# Patient Record
Sex: Female | Born: 1955 | Race: White | Hispanic: No | Smoking: Never smoker
Health system: Southern US, Community
[De-identification: ages and names within clinical notes are randomized; demographics above are authoritative.]

## PROBLEM LIST (undated history)

## (undated) DIAGNOSIS — F32A Depression, unspecified: Secondary | ICD-10-CM

## (undated) DIAGNOSIS — M1990 Unspecified osteoarthritis, unspecified site: Secondary | ICD-10-CM

## (undated) DIAGNOSIS — F419 Anxiety disorder, unspecified: Secondary | ICD-10-CM

## (undated) DIAGNOSIS — K219 Gastro-esophageal reflux disease without esophagitis: Secondary | ICD-10-CM

## (undated) DIAGNOSIS — H269 Unspecified cataract: Secondary | ICD-10-CM

## (undated) DIAGNOSIS — Z9889 Other specified postprocedural states: Secondary | ICD-10-CM

## (undated) DIAGNOSIS — E785 Hyperlipidemia, unspecified: Secondary | ICD-10-CM

## (undated) DIAGNOSIS — R7303 Prediabetes: Secondary | ICD-10-CM

## (undated) DIAGNOSIS — T7840XA Allergy, unspecified, initial encounter: Secondary | ICD-10-CM

## (undated) DIAGNOSIS — G709 Myoneural disorder, unspecified: Secondary | ICD-10-CM

## (undated) DIAGNOSIS — R112 Nausea with vomiting, unspecified: Secondary | ICD-10-CM

## (undated) DIAGNOSIS — M810 Age-related osteoporosis without current pathological fracture: Secondary | ICD-10-CM

## (undated) HISTORY — DX: Allergy, unspecified, initial encounter: T78.40XA

## (undated) HISTORY — DX: Anxiety disorder, unspecified: F41.9

## (undated) HISTORY — PX: OTHER SURGICAL HISTORY: SHX169

## (undated) HISTORY — DX: Depression, unspecified: F32.A

## (undated) HISTORY — DX: Nausea with vomiting, unspecified: R11.2

## (undated) HISTORY — DX: Prediabetes: R73.03

## (undated) HISTORY — DX: Age-related osteoporosis without current pathological fracture: M81.0

## (undated) HISTORY — DX: Unspecified osteoarthritis, unspecified site: M19.90

## (undated) HISTORY — DX: Unspecified cataract: H26.9

## (undated) HISTORY — DX: Hyperlipidemia, unspecified: E78.5

## (undated) HISTORY — DX: Myoneural disorder, unspecified: G70.9

## (undated) HISTORY — DX: Gastro-esophageal reflux disease without esophagitis: K21.9

## (undated) HISTORY — DX: Other specified postprocedural states: Z98.890

---

## 2000-02-11 ENCOUNTER — Other Ambulatory Visit: Admission: RE | Admit: 2000-02-11 | Discharge: 2000-02-11 | Payer: Self-pay | Admitting: Gynecology

## 2001-01-19 ENCOUNTER — Other Ambulatory Visit: Admission: RE | Admit: 2001-01-19 | Discharge: 2001-01-19 | Payer: Self-pay | Admitting: Gynecology

## 2003-02-07 ENCOUNTER — Other Ambulatory Visit: Admission: RE | Admit: 2003-02-07 | Discharge: 2003-02-07 | Payer: Self-pay | Admitting: Gynecology

## 2004-04-12 ENCOUNTER — Other Ambulatory Visit: Admission: RE | Admit: 2004-04-12 | Discharge: 2004-04-12 | Payer: Self-pay | Admitting: Gynecology

## 2006-09-15 ENCOUNTER — Other Ambulatory Visit: Admission: RE | Admit: 2006-09-15 | Discharge: 2006-09-15 | Payer: Self-pay | Admitting: Family Medicine

## 2006-09-16 ENCOUNTER — Encounter: Admission: RE | Admit: 2006-09-16 | Discharge: 2006-11-03 | Payer: Self-pay | Admitting: Family Medicine

## 2010-11-19 ENCOUNTER — Other Ambulatory Visit: Payer: Self-pay | Admitting: Family Medicine

## 2010-11-19 DIAGNOSIS — N63 Unspecified lump in unspecified breast: Secondary | ICD-10-CM

## 2010-11-19 DIAGNOSIS — N644 Mastodynia: Secondary | ICD-10-CM

## 2010-11-26 ENCOUNTER — Ambulatory Visit
Admission: RE | Admit: 2010-11-26 | Discharge: 2010-11-26 | Disposition: A | Discharge status: Home / Self Care | Payer: BC Managed Care – PPO | Source: Ambulatory Visit | Attending: Family Medicine | Admitting: Family Medicine

## 2010-11-26 DIAGNOSIS — N644 Mastodynia: Secondary | ICD-10-CM

## 2010-11-26 DIAGNOSIS — N63 Unspecified lump in unspecified breast: Secondary | ICD-10-CM

## 2011-12-25 ENCOUNTER — Other Ambulatory Visit: Payer: Self-pay | Admitting: Family Medicine

## 2011-12-25 ENCOUNTER — Other Ambulatory Visit (HOSPITAL_COMMUNITY)
Admission: RE | Admit: 2011-12-25 | Discharge: 2011-12-25 | Disposition: A | Discharge status: Home / Self Care | Payer: BC Managed Care – PPO | Source: Ambulatory Visit | Attending: Family Medicine | Admitting: Family Medicine

## 2011-12-25 DIAGNOSIS — Z124 Encounter for screening for malignant neoplasm of cervix: Secondary | ICD-10-CM | POA: Insufficient documentation

## 2011-12-25 DIAGNOSIS — Z1151 Encounter for screening for human papillomavirus (HPV): Secondary | ICD-10-CM | POA: Insufficient documentation

## 2012-07-28 ENCOUNTER — Ambulatory Visit: Payer: BC Managed Care – PPO | Admitting: Sports Medicine

## 2012-07-31 ENCOUNTER — Ambulatory Visit: Payer: BC Managed Care – PPO | Admitting: Sports Medicine

## 2013-09-16 ENCOUNTER — Other Ambulatory Visit: Payer: Self-pay

## 2013-09-16 DIAGNOSIS — Z1231 Encounter for screening mammogram for malignant neoplasm of breast: Secondary | ICD-10-CM

## 2013-09-24 ENCOUNTER — Ambulatory Visit: Payer: BC Managed Care – PPO

## 2013-09-29 ENCOUNTER — Ambulatory Visit
Admission: RE | Admit: 2013-09-29 | Discharge: 2013-09-29 | Disposition: A | Discharge status: Home / Self Care | Payer: 59 | Source: Ambulatory Visit

## 2013-09-29 DIAGNOSIS — Z1231 Encounter for screening mammogram for malignant neoplasm of breast: Secondary | ICD-10-CM

## 2014-12-21 ENCOUNTER — Other Ambulatory Visit: Payer: Self-pay

## 2014-12-21 DIAGNOSIS — Z1231 Encounter for screening mammogram for malignant neoplasm of breast: Secondary | ICD-10-CM

## 2014-12-27 ENCOUNTER — Ambulatory Visit
Admission: RE | Admit: 2014-12-27 | Discharge: 2014-12-27 | Disposition: A | Discharge status: Home / Self Care | Payer: 59 | Source: Ambulatory Visit

## 2014-12-27 DIAGNOSIS — Z1231 Encounter for screening mammogram for malignant neoplasm of breast: Secondary | ICD-10-CM

## 2015-01-10 ENCOUNTER — Other Ambulatory Visit: Payer: Self-pay | Admitting: Family Medicine

## 2015-01-10 DIAGNOSIS — R1310 Dysphagia, unspecified: Secondary | ICD-10-CM

## 2015-01-13 ENCOUNTER — Ambulatory Visit
Admission: RE | Admit: 2015-01-13 | Discharge: 2015-01-13 | Disposition: A | Discharge status: Home / Self Care | Payer: 59 | Source: Ambulatory Visit | Attending: Family Medicine | Admitting: Family Medicine

## 2015-01-13 DIAGNOSIS — R1310 Dysphagia, unspecified: Secondary | ICD-10-CM

## 2017-01-21 ENCOUNTER — Other Ambulatory Visit (HOSPITAL_COMMUNITY)
Admission: RE | Admit: 2017-01-21 | Discharge: 2017-01-21 | Disposition: A | Discharge status: Home / Self Care | Payer: 59 | Source: Ambulatory Visit | Attending: Family Medicine | Admitting: Family Medicine

## 2017-01-21 ENCOUNTER — Other Ambulatory Visit: Payer: Self-pay | Admitting: Family Medicine

## 2017-01-21 DIAGNOSIS — Z01419 Encounter for gynecological examination (general) (routine) without abnormal findings: Secondary | ICD-10-CM | POA: Insufficient documentation

## 2017-01-23 LAB — CYTOLOGY - PAP: Diagnosis: NEGATIVE

## 2019-05-14 ENCOUNTER — Ambulatory Visit: Payer: 59

## 2019-05-21 ENCOUNTER — Ambulatory Visit: Payer: Medicare Other

## 2019-05-31 ENCOUNTER — Ambulatory Visit: Payer: 59

## 2020-03-22 ENCOUNTER — Other Ambulatory Visit: Payer: Self-pay | Admitting: Family Medicine

## 2020-03-22 DIAGNOSIS — Z1231 Encounter for screening mammogram for malignant neoplasm of breast: Secondary | ICD-10-CM

## 2020-03-22 DIAGNOSIS — M85859 Other specified disorders of bone density and structure, unspecified thigh: Secondary | ICD-10-CM

## 2020-06-02 ENCOUNTER — Encounter: Payer: Medicare Other | Attending: Family Medicine | Admitting: Registered"

## 2020-06-02 DIAGNOSIS — R7303 Prediabetes: Secondary | ICD-10-CM | POA: Diagnosis not present

## 2020-06-08 ENCOUNTER — Encounter: Payer: Self-pay | Admitting: Registered"

## 2020-06-08 NOTE — Progress Notes (Signed)
On 06/02/20 patient completed Core Session 1 of Diabetes Prevention Program course virtually with [facility]. The following learning objectives were met by the patient during this class:   Virtual Visit via Video Note  I connected with Kern Alberta by a video enabled application and verified that I am speaking with the correct person.  Location: Patient: Home.  Provider: Office.     Learning Objectives:   Be able to explain the purpose and benefits of the National Diabetes Prevention Program.   Be able to describe the events that will take place at every session.   Know the weight loss and physical activity goals established by the [facility] Diabetes Prevention Program.   Know their own individual weight loss and physical activity goals.   Be able to explain the important effect of self-monitoring on behavior change.   Goals:  . Record food and beverage intake in "Food and Activity Tracker" over the next week.  . E-mail completed "Food and Activity Tracker" to Lifestyle Coach next week before session 2. . Circle the foods or beverages you think are highest in fat and calories in your food tracker. . Read the labels on the food you buy, and consider using measuring cups and spoons to help you calculate the amount you eat. We will talk about measuring in more detail in the coming weeks.   Follow-Up Plan:  Attend Core Session 2 next week.   E-mail completed "Food and Activity Tracker" to Lifestyle Coach next week before class.

## 2020-06-09 ENCOUNTER — Encounter: Payer: Medicare Other | Attending: Family Medicine | Admitting: Registered"

## 2020-06-09 DIAGNOSIS — R7303 Prediabetes: Secondary | ICD-10-CM | POA: Diagnosis not present

## 2020-06-12 ENCOUNTER — Other Ambulatory Visit: Payer: Medicare Other

## 2020-06-12 ENCOUNTER — Ambulatory Visit: Payer: Medicare Other

## 2020-06-12 ENCOUNTER — Encounter: Payer: Self-pay | Admitting: Registered"

## 2020-06-12 NOTE — Progress Notes (Addendum)
On 06/09/20 patient completed Core Session 2 of Diabetes Prevention Program course virtually with [facility]. The following learning objectives were met by the patient during this class:   Virtual Visit via Video Note  I connected with Tara Carr on 06/09/20 at  3:30 PM EST by a video enabled application and verified that I am speaking with the correct person using two identifiers.  Location: Patient: Home.  Provider: Office.   Learning Objectives:  Self-monitor their weight during the weeks following Session 2.   Describe the relationship between fat and calories.   Explain the reason for, and basic principles of, self-monitoring fat grams and calories.   Identify their personal fat gram goals.   Use the ?Fat and Calorie Counter to calculate the calories and fat grams of a given selection of foods.   Keep a running total of the fat grams they eat each day.   Calculate fat, calories, and serving sizes from nutrition labels.   Goals:   Weigh yourself at the same time each day, or every few days, and record your weight in your Food and Activity Tracker.  Write down everything you eat and drink in your Food and Activity Tracker.  Measure portions as much as you can, and start reading labels.   Use the ?Fat and Calorie Counter to figure out the amount of fat and calories in what you ate, and write the amount down in your Food and Activity Tracker.  Keep a running fat gram total throughout the day. Come as close to your fat gram goal as you can.   Follow-Up Plan:  Attend Core Session 3 next week.   Email completed  "Food and Activity Tracker" to Lifestyle Coach next week.   

## 2020-06-16 ENCOUNTER — Encounter: Payer: Medicare Other | Attending: Family Medicine | Admitting: Registered"

## 2020-06-16 DIAGNOSIS — R7303 Prediabetes: Secondary | ICD-10-CM | POA: Insufficient documentation

## 2020-06-22 ENCOUNTER — Encounter: Payer: Self-pay | Admitting: Registered"

## 2020-06-22 NOTE — Progress Notes (Signed)
On 06/16/20 patient completed Core Session 3 of Diabetes Prevention Program course virtually with [facility]. The following learning objectives were met by the patient during this class:   Virtual Visit via Video Note  I connected with Tara Carr on 06/16/20 at  3:30 PM EST by a video enabled telemedicine application and verified that I am speaking with the correct person using two identifiers.  Location: Patient: Home.  Provider: Office.    Learning Objectives:  Weigh and measure foods.  Estimate the fat and calorie content of common foods.  Describe three ways to eat less fat and fewer calories.  Create a plan to eat less fat for the following week.   Goals:   Track weight when weighing outside of class.   Track food and beverages eaten each day in Food and Activity Tracker and include fat grams and calories for each.   Try to stay within fat gram goal.   Complete plan for eating less high fat foods and answer related homework questions.    Follow-Up Plan:  Attend Core Session 4 next week.   Bring completed "Food and Activity Tracker" next week to be reviewed by Lifestyle Coach.

## 2020-06-23 ENCOUNTER — Encounter (HOSPITAL_BASED_OUTPATIENT_CLINIC_OR_DEPARTMENT_OTHER): Payer: Medicare Other | Admitting: Registered"

## 2020-06-23 DIAGNOSIS — R7303 Prediabetes: Secondary | ICD-10-CM | POA: Diagnosis not present

## 2020-06-26 ENCOUNTER — Encounter: Payer: Self-pay | Admitting: Registered"

## 2020-06-26 NOTE — Progress Notes (Signed)
On 06/23/20 patient completed Core Session 4 of Diabetes Prevention Program course virtually with [facility]. The following learning objectives were met by the patient during this class:   Virtual Visit via Video Note  I connected with Tara Carr on 06/23/20 at  3:30 PM EST by a video enabled application and verified that I am speaking with the correct person using two identifiers.  Location: Patient: Home.  Provider: Office.   Learning Objectives:  Describe the MyPlate food guide and its recommendations, including how to reduce fat and calories in our diet.  Compare and contrast MyPlate guidelines with participants' eating habits.  List ways to replace high-fat and high-calorie foods with low-fat and low-calorie foods.  Explain the importance of eating plenty of whole grains, vegetables, and fruits, while staying within fat gram goals.  Explain the importance of eating foods from all groups of MyPlate and of eating a variety of foods from within each group.  Explain why a balanced diet is beneficial to health.  Goals:   Record weight taken outside of class.   Track foods and beverages eaten each day in the "Food and Activity Tracker," including calories and fat grams for each item.   Practice comparing what you eat with the recommendations of MyPlate using the "Rate Your Plate" handout.   Complete the "Rate Your Plate" handout form on at least 3 days.   Answer homework questions.   Follow-Up Plan:  Attend Core Session 5 next week.   Email completed "Food and Activity Tracker" next week to be reviewed by Lifestyle Coach.

## 2020-06-30 ENCOUNTER — Encounter: Payer: Self-pay | Admitting: Registered"

## 2020-06-30 ENCOUNTER — Encounter (HOSPITAL_BASED_OUTPATIENT_CLINIC_OR_DEPARTMENT_OTHER): Payer: Medicare Other | Admitting: Registered"

## 2020-06-30 DIAGNOSIS — R7303 Prediabetes: Secondary | ICD-10-CM

## 2020-06-30 NOTE — Progress Notes (Signed)
On 06/30/20 patient completed Core Session 5 of Diabetes Prevention Program course virtually with [facility]. The following learning objectives were met by the patient during this class:   Virtual Visit via Video Note  I connected with Tara Carr on 06/30/20 at  3:30 PM EDT by a video enabled application and verified that I am speaking with the correct person using two identifiers.  Location: Patient: Home.  Provider: Office.   Learning Objectives:  Establish a physical activity goal.  Explain the importance of the physical activity goal.  Describe their current level of physical activity.  Name ways that they are already physically active.  Develop personal plans for physical activity for the next week.   Goals:   Record weight taken outside of class.   Track foods and beverages eaten each day in the "Food and Activity Tracker," including calories and fat grams for each item.   Make an Activity Plan including date, specific type of activity, and length of time you plan to be active that includes at last 60 minutes of activity for the week.   Track activity type, minutes you were active, and distance you reached each day in the "Food and Activity Tracker."   Follow-Up Plan: . Attend Core Session 6 next week.  . E-mail completed "Food and Activity Tracker" to Lifestyle Coach next week before class

## 2020-07-07 ENCOUNTER — Encounter (HOSPITAL_BASED_OUTPATIENT_CLINIC_OR_DEPARTMENT_OTHER): Payer: Medicare Other | Admitting: Registered"

## 2020-07-07 DIAGNOSIS — R7303 Prediabetes: Secondary | ICD-10-CM | POA: Diagnosis not present

## 2020-07-12 ENCOUNTER — Encounter: Payer: Self-pay | Admitting: Registered"

## 2020-07-12 NOTE — Progress Notes (Signed)
On 07/07/20 patient completed Core Session 6 of Diabetes Prevention Program course virtually with [facility]. The following learning objectives were met by the patient during this class:   Virtual Visit via Video Note  I connected with Tara Carr on 07/07/20 at  3:30 PM EDT by a video enabled application and verified that I am speaking with the correct person using two identifiers.  Location: Patient: Home.  Provider: Office.   Learning Objectives:  Graph their daily physical activity.   Describe two ways of finding the time to be active.   Define "lifestyle activity."   Describe how to prevent injury.   Develop an activity plan for the coming week.   Goals:   Record weight taken outside of class.   Track foods and beverages eaten each day in the "Food and Activity Tracker," including calories and fat grams for each item.    Track activity type, minutes you were active, and distance you reached each day in the "Food and Activity Tracker."   Set aside one 20 to 30-minute block of time every day or find two or more periods of 10 to15 minutes each for physical activity.   Warm up, cool down, and stretch.  Make a Physical Activities Plan for the Week.   Follow-Up Plan:  Attend Core Session 7 next week.   E-mail completed "Food and Activity Tracker" to Lifestyle Coach next week before class

## 2020-07-14 ENCOUNTER — Encounter: Payer: Medicare Other | Attending: Family Medicine | Admitting: Registered"

## 2020-07-14 DIAGNOSIS — R7303 Prediabetes: Secondary | ICD-10-CM | POA: Diagnosis not present

## 2020-07-21 ENCOUNTER — Encounter: Payer: Self-pay | Admitting: Registered"

## 2020-07-21 NOTE — Progress Notes (Signed)
On 07/14/20 patient completed Core Session 7 of Diabetes Prevention Program course virtually with [facility]. The following learning objectives were met by the patient during this class:   Virtual Visit via Video Note  I connected with Tara Carr on 07/14/20 at  3:30 PM EDT by a video enabled application and verified that I am speaking with the correct person using two identifiers.  Location: Patient: Home.  Provider: Office.   Learning Objectives:  Define calorie balance.  Explain how healthy eating and being active are related in terms of calorie balance.   Describe the relationship between calorie balance and weight loss.   Describe his or her progress as it relates to calorie balance.   Develop an activity plan for the coming week.   Goals:   Record weight taken outside of class.   Track foods and beverages eaten each day in the "Food and Activity Tracker," including calories and fat grams for each item.    Track activity type, minutes you were active, and distance you reached each day in the "Food and Activity Tracker."   Set aside one 20 to 30-minute block of time every day or find two or more periods of 10 to15 minutes each for physical activity.   Make a Physical Activities Plan for the Week.   Make active lifestyle choices all through the day   Stay at or go slightly over activity goal.   Follow-Up Plan:  Attend Core Session 8 next week.   E-mail completed "Food and Activity Tracker" to Lifestyle Coach next week before class

## 2020-07-31 ENCOUNTER — Ambulatory Visit
Admission: RE | Admit: 2020-07-31 | Discharge: 2020-07-31 | Disposition: A | Discharge status: Home / Self Care | Payer: Medicare Other | Source: Ambulatory Visit | Attending: Family Medicine | Admitting: Family Medicine

## 2020-07-31 ENCOUNTER — Other Ambulatory Visit: Payer: Self-pay

## 2020-07-31 DIAGNOSIS — Z1231 Encounter for screening mammogram for malignant neoplasm of breast: Secondary | ICD-10-CM

## 2020-08-04 ENCOUNTER — Encounter: Payer: Self-pay | Admitting: Registered"

## 2020-08-04 ENCOUNTER — Encounter (HOSPITAL_BASED_OUTPATIENT_CLINIC_OR_DEPARTMENT_OTHER): Payer: Medicare Other | Admitting: Registered"

## 2020-08-04 DIAGNOSIS — R7303 Prediabetes: Secondary | ICD-10-CM

## 2020-08-04 DIAGNOSIS — Z713 Dietary counseling and surveillance: Secondary | ICD-10-CM | POA: Diagnosis not present

## 2020-08-04 NOTE — Progress Notes (Signed)
On 08/04/20 patient completed Core Session 9 of Diabetes Prevention Program course virtually with [facility]. The following learning objectives were met by the patient during this class:   Virtual Visit via Video Note  I connected with Tara Carr by a video enabled application and verified that I am speaking with the correct person using two identifiers.  Location: Patient: Home.  Provider: Office.   Learning Objectives:  List and describe five steps to problem solving.   Apply the five problem solving steps to resolve a problem he or she has with eating less fat and fewer calories or being more active.   Goals:   Record weight taken outside of class.   Track foods and beverages eaten each day in the "Food and Activity Tracker," including calories and fat grams for each item.    Track activity type, minutes you were active, and distance you reached each day in the "Food and Activity Tracker."   Set aside one 20 to 30-minute block of time every day or find two or more periods of 10 to15 minutes each for physical activity.   Use problem solving action plan created during session to problem solve.   Follow-Up Plan:  Attend Core Session 10 next week.   Email completed "Food and Activity Tracker" next week to be reviewed by Lifestyle Coach.  Email menus from favorite restaurants to next session for future discussion.

## 2020-08-07 ENCOUNTER — Ambulatory Visit
Admission: RE | Admit: 2020-08-07 | Discharge: 2020-08-07 | Disposition: A | Discharge status: Home / Self Care | Payer: Medicare Other | Source: Ambulatory Visit | Attending: Family Medicine | Admitting: Family Medicine

## 2020-08-07 ENCOUNTER — Other Ambulatory Visit: Payer: Medicare Other

## 2020-08-07 ENCOUNTER — Other Ambulatory Visit: Payer: Self-pay

## 2020-08-07 DIAGNOSIS — M85859 Other specified disorders of bone density and structure, unspecified thigh: Secondary | ICD-10-CM

## 2020-08-11 ENCOUNTER — Encounter: Payer: Medicare Other | Admitting: Registered"

## 2020-08-11 DIAGNOSIS — R7303 Prediabetes: Secondary | ICD-10-CM

## 2020-08-25 ENCOUNTER — Other Ambulatory Visit: Payer: Self-pay

## 2020-08-25 ENCOUNTER — Encounter: Payer: Medicare Other | Attending: Family Medicine | Admitting: Dietician

## 2020-08-25 DIAGNOSIS — R7303 Prediabetes: Secondary | ICD-10-CM | POA: Diagnosis not present

## 2020-08-25 NOTE — Progress Notes (Signed)
On 08/25/2020 patient completed Session 11 of Diabetes Prevention Program course virtually with [facility]. By the end of this session patients are able to complete the following objectives:   Virtual Visit via Video Note  I connected with Tara Carr, 06-06-1955 by a video enabled application and verified that I am speaking with the correct person using two identifiers.  Location: Patient: Virtual Provider: Office  Learning Objectives:  Give examples of negative thoughts that could prevent them from meeting their goals of losing weight and being more physically active.   Describe how to stop negative thoughts and talk back to them with positive thoughts.   Practice 1) stopping negative thoughts and 2) talking back to negative thoughts with positive ones.    Goals:   Record weight taken outside of class.   Track foods and beverages eaten each day in the "Food and Activity Tracker," including calories and fat grams for each item.    Track activity type, minutes you were active, and distance you reached each day in the "Food and Activity Tracker."   If you have any negative thoughts-write them in your Food and Activity Trackers, along with how you talked back to them. Practice stopping negative thoughts and talking back to them with positive thoughts.   Follow-Up Plan:  Attend Core Session 12 next week.   Email completed "Food and Activity Tracker" before next week to be reviewed by Lifestyle Coach.

## 2020-09-01 ENCOUNTER — Encounter (HOSPITAL_BASED_OUTPATIENT_CLINIC_OR_DEPARTMENT_OTHER): Payer: Medicare Other | Admitting: Registered"

## 2020-09-01 DIAGNOSIS — Z713 Dietary counseling and surveillance: Secondary | ICD-10-CM | POA: Diagnosis not present

## 2020-09-01 DIAGNOSIS — R7303 Prediabetes: Secondary | ICD-10-CM | POA: Diagnosis not present

## 2020-09-06 ENCOUNTER — Encounter: Payer: Self-pay | Admitting: Registered"

## 2020-09-06 NOTE — Progress Notes (Signed)
Patient was seen on 09/01/20 for the Core Session 12 of Diabetes Prevention Program course at [facility]. By the end of this session patients are able to complete the following objectives:   Virtual Visit via Video Note  I connected with Tara Carr on 09/01/20 at  3:30 PM EDT by a video enabled application and verified that I am speaking with the correct person using two identifiers.  Location: Patient: Home.  Provider: Office.   Learning Objectives:  Describe their current progress toward defined goals.  Describe common causes for slipping from healthy eating or being  active.  Explain what to do to get back on their feet after a slip.  Goals:   Record weight taken outside of class.   Track foods and beverages eaten each day in the "Food and Activity Tracker," including calories and fat grams for each item.    Track activity type, minutes active, and distance reached each day in the "Food and Activity Tracker."   Try out the two action plans created during session- "Slips from Healthy Eating: Action Plan" and "Slips from Being Active: Action Plan"  Answer questions on the handout.   Follow-Up Plan:  Attend Core Session 13 next week.   Bring completed "Food and Activity Tracker" next week to be reviewed by Lifestyle Coach.

## 2020-09-08 ENCOUNTER — Encounter (HOSPITAL_BASED_OUTPATIENT_CLINIC_OR_DEPARTMENT_OTHER): Payer: Medicare Other | Admitting: Registered"

## 2020-09-08 ENCOUNTER — Encounter: Payer: Self-pay | Admitting: Registered"

## 2020-09-08 DIAGNOSIS — R7303 Prediabetes: Secondary | ICD-10-CM

## 2020-09-08 DIAGNOSIS — Z713 Dietary counseling and surveillance: Secondary | ICD-10-CM

## 2020-09-08 NOTE — Progress Notes (Signed)
On 09/08/20 patient completed the Core Session 13 of Diabetes Prevention Program course virtually with [facility]. By the end of this session patients are able to complete the following objectives:   Virtual Visit via Video Note  I connected with Kern Alberta on 09/08/20 at  3:30 PM EDT by a video enabled application and verified that I am speaking with the correct person using two identifiers.  Location: Patient: Home.  Provider: Office.   Learning Objectives:  Describe ways to add interest and variety to their activity plans.  Define ?aerobic fitness.  Explain the four F.I.T.T. principles (frequency, intensity, time, and type of activity) and how they relate to aerobic fitness.   Goals:   Record weight taken outside of class.   Track foods and beverages eaten each day in the "Food and Activity Tracker," including calories and fat grams for each item.    Track activity type, minutes you were active, and distance you reached each day in the "Food and Activity Tracker."   Do your best to reach activity goal for the week.  Use one of the F.I.T.T. principles to jump start workouts.  Document activity level on the "To Do Next Week" handout.  Follow-Up Plan:  Attend Core Session 14 next week.   Email completed "Food and Activity Tracker" before next week to be reviewed by Lifestyle Coach.

## 2020-09-15 ENCOUNTER — Encounter: Payer: Medicare Other | Attending: Family Medicine | Admitting: Registered"

## 2020-09-15 DIAGNOSIS — R7303 Prediabetes: Secondary | ICD-10-CM | POA: Insufficient documentation

## 2020-09-21 ENCOUNTER — Encounter: Payer: Self-pay | Admitting: Registered"

## 2020-09-21 NOTE — Progress Notes (Signed)
On 09/15/20 patient completed Core Session 14 of Diabetes Prevention Program course virtually with [facility]. By the end of this session patients are able to complete the following objectives:   Virtual Visit via Video Note  I connected with Kern Alberta at  3:30 PM EDT by a video enabled application and verified that I am speaking with the correct person using two identifiers.  Location: Patient: Home.  Provider: Office.   Learning Objectives: Give examples of problem social cues and helpful social cues.  Explain how to remove problem social cues and add helpful ones.  Describe ways of coping with vacations and social events such as parties, holidays, and visits from relatives and friends.  Create an action plan to change a problem social cue and add a helpful one.   Goals:  Record weight taken outside of class.  Track foods and beverages eaten each day in the "Food and Activity Tracker," including calories and fat grams for each item.   Track activity type, minutes you were active, and distance you reached each day in the "Food and Activity Tracker."  Do your best to reach activity goal for the week. Use action plan created during session to change a problem social cue and add a helpful social cue.  Answer questions regarding success of changing social cues on "To Do Next Week" handout.   Follow-Up Plan: Attend Core Session 15 next week.  Email completed "Food and Activity Tracker" before next week to be reviewed by Lifestyle Coach.

## 2020-09-22 ENCOUNTER — Encounter (HOSPITAL_BASED_OUTPATIENT_CLINIC_OR_DEPARTMENT_OTHER): Payer: Medicare Other | Admitting: Registered"

## 2020-09-22 DIAGNOSIS — R7303 Prediabetes: Secondary | ICD-10-CM | POA: Diagnosis not present

## 2020-09-29 ENCOUNTER — Encounter: Payer: Self-pay | Admitting: Registered"

## 2020-09-29 ENCOUNTER — Encounter (HOSPITAL_BASED_OUTPATIENT_CLINIC_OR_DEPARTMENT_OTHER): Payer: Medicare Other | Admitting: Registered"

## 2020-09-29 DIAGNOSIS — R7303 Prediabetes: Secondary | ICD-10-CM | POA: Diagnosis not present

## 2020-09-29 NOTE — Progress Notes (Signed)
On 09/22/20 patient completed Core Session 15 of Diabetes Prevention Program course virtually with [facility]. By the end of this session patients are able to complete the following objectives:   Virtual Visit via Video Note  I connected with Tara Carr on 09/22/20 at  3:30 PM EDT by a video enabled application and verified that I am speaking with the correct person using two identifiers.  Location: Patient: Home.  Provider: Office.   Learning Objectives: Explain how to prevent stress or cope with unavoidable stress.  Describe how this program can be a source of stress.  Explain how to manage stressful situations.  Create and follow an action plan for either preventing or coping with a stressful situation.   Goals:  Record weight taken outside of class.  Track foods and beverages eaten each day in the "Food and Activity Tracker," including calories and fat grams for each item.   Track activity type, minutes you were active, and distance you reached each day in the "Food and Activity Tracker."  Do your best to reach activity goal for the week. Follow your action plan to reduce stress.  Answer questions on handout regarding success of action plan.   Follow-Up Plan: Attend Core Session 16 next week.  Email completed "Food and Activity Tracker" before next week to be reviewed by Lifestyle Coach.

## 2020-10-05 ENCOUNTER — Encounter: Payer: Self-pay | Admitting: Registered"

## 2020-10-05 NOTE — Progress Notes (Signed)
On 09/29/20 patient completed Core Session 16 of Diabetes Prevention Program course virtually with [facility]. By the end of this session patients are able to complete the following objectives:   Virtual Visit via Video Note  I connected Tara Carr  on 09/29/20 at  3:30 PM EDT by a video enabled application and verified that I am speaking with the correct person using two identifiers.  Location: Patient: Home.  Provider: Office.   Learning Objectives: Measure their progress toward weight and physical activity goals since Session 1.  Develop a plan for improving progress, if their goals have not yet been attained.  Describe ways to stay motivated long-term.   Goals:  Record weight taken outside of class.  Track foods and beverages eaten each day in the "Food and Activity Tracker," including calories and fat grams for each item.   Track activity type, minutes you were active, and distance you reached each day in the "Food and Activity Tracker."  Utilize action plan to help stay motivated and complete questions on "To Do List."   Follow-Up Plan: Attend session 17 in two weeks.  Email completed "Food and Activity Tracker" before next session to be reviewed by Lifestyle Coach.

## 2020-10-06 ENCOUNTER — Encounter: Payer: Medicare Other | Admitting: Registered"

## 2020-10-06 NOTE — Progress Notes (Deleted)
On 10/06/20 patient attended a virtual grocery store tour session as part of the Diabetes Prevention Program with [facility].  Virtual Visit via Video Note  I connected with  by a video enabled application and verified that I am speaking with the correct person using two identifiers.  Location: Patient: *** Provider: ***   Learning Objectives: Develop a plan for our grocery shopping experience Putting together a list How to navigate the grocery store Identify 4 main sections of the grocery store Produce Meat/Poultry/Fish Dairy  Inside Aisles Consider tips for shopping in each of these four sections Reflect on our own shopping habits Create a new goal for our next grocery shopping experience Engage in a group discussion  Goals:  Record weight taken outside of class.  Track foods and beverages eaten each day in the "Food and Activity Tracker," including calories and fat grams for each item.  Create one new goal for the next grocery shopping experience based on the information provided today  Follow-Up Plan: Attend next session.  Email completed "Food and Activity Tracker" before next session to be reviewed by Lifestyle Coach.

## 2020-10-20 ENCOUNTER — Encounter: Payer: Medicare Other | Attending: Family Medicine | Admitting: Registered"

## 2020-10-20 DIAGNOSIS — R7303 Prediabetes: Secondary | ICD-10-CM | POA: Diagnosis not present

## 2020-10-27 ENCOUNTER — Encounter: Payer: Self-pay | Admitting: Registered"

## 2020-10-27 NOTE — Progress Notes (Signed)
On 10/20/20 patient completed a post core session of the Diabetes Prevention Program course virtually with [facility]. By the end of this session patients are able to complete the following objectives:   Virtual Visit via Video Note  I connected with Tara Carr by a video enabled application and verified that I am speaking with the correct person using two identifiers.  Location: Patient: Home.  Provider: Office.   Learning Objectives: Identify how to maintain and/or continue working toward program goals for the remainder of the program.  Describe ways that food and activity tracking can assist them in maintaining/reaching program goals.  Identify progress they have made since the beginning of the program.   Goals:  Record weight taken outside of class.  Track foods and beverages eaten each day in the "Food and Activity Tracker," including calories and fat grams for each item.   Track activity type, minutes you were active, and distance you reached each day in the "Food and Activity Tracker."   Follow-Up Plan: Attend session 18 in two weeks.  Email completed "Food and Activity Trackers" before next session to be reviewed by Lifestyle Coach.

## 2020-11-03 ENCOUNTER — Encounter: Payer: Self-pay | Admitting: Registered"

## 2020-11-03 ENCOUNTER — Encounter (HOSPITAL_BASED_OUTPATIENT_CLINIC_OR_DEPARTMENT_OTHER): Payer: Medicare Other | Admitting: Registered"

## 2020-11-03 DIAGNOSIS — R7303 Prediabetes: Secondary | ICD-10-CM

## 2020-11-03 NOTE — Progress Notes (Signed)
On 11/03/20 patient completed a post core session of the Diabetes Prevention Program course virtually with [facility]. By the end of this session patients are able to complete the following objectives:   Virtual Visit via Video Note  I connected with Kern Alberta by a video enabled application and verified that I am speaking with the correct person using two identifiers.  Location: Patient: Home.  Provider: Office.   Learning Objectives: Describe the importance of having regular meals each day and how skipping meals can negatively affect food choices and weight.  Plan out balanced meals and snacks. List ways to avoid unplanned snacking.   Goals:  Record weight taken outside of class.  Track foods and beverages eaten each day in the "Food and Activity Tracker," including calories and fat grams for each item.   Track activity type, minutes you were active, and distance you reached each day in the "Food and Activity Tracker."   Follow-Up Plan: Attend next session.  Email completed "Food and Activity Trackers" before next session to be reviewed by Lifestyle Coach.

## 2020-11-17 ENCOUNTER — Ambulatory Visit (HOSPITAL_BASED_OUTPATIENT_CLINIC_OR_DEPARTMENT_OTHER): Payer: Medicare Other | Admitting: Registered"

## 2020-11-17 DIAGNOSIS — R7303 Prediabetes: Secondary | ICD-10-CM

## 2020-11-23 ENCOUNTER — Encounter: Payer: Self-pay | Admitting: Registered"

## 2020-11-23 NOTE — Progress Notes (Signed)
On 11/17/20 patient completed a post core session of the Diabetes Prevention Program course virtually with [facility]. By the end of this session patients are able to complete the following objectives:   Virtual Visit via Video Note  I connected with Tara Carr by a video enabled application and verified that I am speaking with the correct person using two identifiers.  Location: Patient: Home.  Provider: Office.   Learning Objectives: Describe the importance of having regular meals each day and how skipping meals can negatively affect food choices and weight.  Plan out balanced meals and snacks. List ways to avoid unplanned snacking.   Goals:  Record weight taken outside of class.  Track foods and beverages eaten each day in the "Food and Activity Tracker," including calories and fat grams for each item.   Track activity type, minutes you were active, and distance you reached each day in the "Food and Activity Tracker."   Follow-Up Plan: Attend next session.  Email completed "Food and Activity Trackers" before next session to be reviewed by Lifestyle Coach.

## 2020-12-08 ENCOUNTER — Ambulatory Visit (HOSPITAL_BASED_OUTPATIENT_CLINIC_OR_DEPARTMENT_OTHER): Payer: Medicare Other | Admitting: Registered"

## 2020-12-08 DIAGNOSIS — R7303 Prediabetes: Secondary | ICD-10-CM | POA: Diagnosis not present

## 2020-12-13 ENCOUNTER — Encounter: Payer: Self-pay | Admitting: Registered"

## 2020-12-13 NOTE — Progress Notes (Signed)
On 12/08/20 patient completed a post core session of the Diabetes Prevention Program course virtually with [facility]. By the end of this session patients are able to complete the following objectives:   Virtual Visit via Video Note  I connected with Tara Carr by a video enabled application and verified that I am speaking with the correct person using two identifiers.  Location: Patient: Home.  Provider: Office.   Learning Objectives: Identify which foods contain carbohydrates.  List functions for carbohydrates on the body.  Describe the relationship between carbohydrate intake and blood sugar.  Create balanced snack choices.   Goals:  Record weight taken outside of class.  Track foods and beverages eaten each day in the "Food and Activity Tracker," including calories and fat grams for each item.   Track activity type, minutes you were active, and distance you reached each day in the "Food and Activity Tracker."   Follow-Up Plan: Attend next session.  Email completed "Food and Activity Trackers" before next session to be reviewed by Lifestyle Coach.

## 2020-12-19 ENCOUNTER — Encounter: Payer: Self-pay | Admitting: Registered"

## 2020-12-19 NOTE — Progress Notes (Signed)
On 08/11/20 patient completed Core Session 10 of Diabetes Prevention Program course virtually with [facility]. The following learning objectives were met by the patient during this class:   Virtual Visit via Video Note  I connected with Tara Carr by a video enabled application and verified that I am speaking with the correct person using two identifiers.  Location: Patient: Home.  Provider: Office.   Learning Objectives: List and describe the four keys for healthy eating out.  Give examples of how to apply these keys at the type of restaurants that the participants go to regularly.  Make an appropriate meal selection from a restaurant menu.  Demonstrate how to ask for a substitute item using assertive language and a polite tone of voice.    Goals:  Record weight taken outside of class.  Track foods and beverages eaten each day in the "Food and Activity Tracker," including calories and fat grams for each item.   Track activity type, minutes you were active, and distance you reached each day in the "Food and Activity Tracker."  Set aside one 20 to 30-minute block of time every day or find two or more periods of 10 to15 minutes each for physical activity.  Utilize positive action plan and complete questions on "To Do List."   Follow-Up Plan: Attend Core Session 11.  Email completed "Food and Activity Tracker" to be reviewed by Lifestyle Coach.

## 2020-12-22 ENCOUNTER — Encounter: Payer: Medicare Other | Attending: Family Medicine | Admitting: Registered"

## 2020-12-22 DIAGNOSIS — R7303 Prediabetes: Secondary | ICD-10-CM | POA: Insufficient documentation

## 2020-12-25 ENCOUNTER — Other Ambulatory Visit: Payer: Self-pay

## 2020-12-28 ENCOUNTER — Encounter: Payer: Self-pay | Admitting: Registered"

## 2020-12-28 NOTE — Progress Notes (Addendum)
On 12/22/20 patient completed a post core session of the Diabetes Prevention Program course virtually with [facility]. By the end of this session patients are able to complete the following objectives:   Virtual Visit Via Video Note:  I connected with Tara Carr by a video enabled application and verified that I am speaking with the correct person using two identifiers.  Location: Patient: Home.  Provider: Office.   Learning Objectives: Describe the difference between a lapse and a relapse. List steps to prevent a lapse from becoming a relapse.  Identify situations that increase risk of having a lapse.  Make a plan to help prevent lapses and recover after a lapse has occurred.   Goals:  Record weight taken outside of class.  Track foods and beverages eaten each day in the "Food and Activity Tracker," including calories and fat grams for each item.   Track activity type, minutes you were active, and distance you reached each day in the "Food and Activity Tracker."   Follow-Up Plan: Attend next session.  Email completed "Food and Activity Trackers" before next session to be reviewed by Lifestyle Coach.

## 2021-01-26 ENCOUNTER — Encounter: Payer: Medicare Other | Attending: Family Medicine | Admitting: Registered"

## 2021-01-26 DIAGNOSIS — R7303 Prediabetes: Secondary | ICD-10-CM | POA: Insufficient documentation

## 2021-02-12 ENCOUNTER — Other Ambulatory Visit: Payer: Self-pay

## 2021-02-15 ENCOUNTER — Encounter: Payer: Self-pay | Admitting: Registered"

## 2021-02-15 NOTE — Progress Notes (Signed)
On 01/26/21 patient completed the Diabetes Prevention Program course virtually with [facility]. By the end of this session patients are able to complete the following objectives:   Virtual Visit via Video Note  I connected with Kern Alberta by a video enabled application and verified that I am speaking with the correct person using two identifiers.  Location: Patient: Home.  Provider: Office.   Learning Objectives: Define fiber and describe the difference between insoluble and soluble fiber  List foods that are good sources of fiber Explain the health benefits of fiber  Describe ways to increase volume of meals and snacks while staying within fat goal.   Goals:  Record weight taken outside of class.  Track foods and beverages eaten each day in the "Food and Activity Tracker," including calories and fat grams for each item.   Track activity type, minutes you were active, and distance you reached each day in the "Food and Activity Tracker."   Follow-Up Plan: Attend next session.  Email completed "Food and Activity Trackers" before next session to be reviewed by Lifestyle Coach.

## 2021-02-23 ENCOUNTER — Encounter: Payer: Medicare Other | Attending: Family Medicine | Admitting: Registered"

## 2021-02-23 DIAGNOSIS — R7303 Prediabetes: Secondary | ICD-10-CM

## 2021-03-02 ENCOUNTER — Encounter: Payer: Self-pay | Admitting: Registered"

## 2021-03-02 NOTE — Progress Notes (Signed)
On 02/23/21 patient completed a post core session of the Diabetes Prevention Program course virtually with [facility]. By the end of this session patients are able to complete the following objectives:   I connected with Tara Carr by a video enabled application and verified that I am speaking with the correct person using two identifiers.  Location: Patient: Home.  Provider: Office.   Learning Objectives: Describe how to incorporate more fruits and vegetables into meals. List criteria for selecting good quality fruits and vegetables at the store.  Define mindful eating. List the benefits of eating mindfully.   Goals:  Record weight taken outside of class.  Track foods and beverages eaten each day in the "Food and Activity Tracker," including calories and fat grams for each item.   Track activity type, minutes you were active, and distance you reached each day in the "Food and Activity Tracker."   Follow-Up Plan: Attend next session.  Email completed "Food and Activity Trackers" before next session to be reviewed by Lifestyle Coach.

## 2021-03-23 ENCOUNTER — Encounter: Payer: Medicare Other | Attending: Family Medicine | Admitting: Registered"

## 2021-03-23 DIAGNOSIS — R7303 Prediabetes: Secondary | ICD-10-CM | POA: Insufficient documentation

## 2021-03-30 ENCOUNTER — Encounter: Payer: Self-pay | Admitting: Registered"

## 2021-03-30 NOTE — Progress Notes (Signed)
On 03/23/21 patient completed a post core session of the Diabetes Prevention Program course virtually with [facility].   Virtual Visit via Video Note  I connected with Tara Carr by a video enabled application and verified that I am speaking with the correct person using two identifiers.  Location: Patient: Home.  Provider: Office.   Learning Objectives: Describe the relationship between nutrition and bone health.  List ways to prevent fractures and falls.  Goals:  Record weight taken outside of class.  Track foods and beverages eaten each day in the "Food and Activity Tracker," including calories and fat grams for each item.   Track activity type, minutes you were active, and distance you reached each day in the "Food and Activity Tracker."   Follow-Up Plan: Attend session next monthly session.  Bring completed "Food and Activity Trackers" to next session to be reviewed by Lifestyle Coach.

## 2021-04-27 ENCOUNTER — Encounter: Payer: Medicare Other | Attending: Family Medicine | Admitting: Registered"

## 2021-04-27 DIAGNOSIS — R7303 Prediabetes: Secondary | ICD-10-CM

## 2021-05-04 ENCOUNTER — Encounter: Payer: Self-pay | Admitting: Registered"

## 2021-05-04 NOTE — Progress Notes (Signed)
On 04/27/21 patient completed a post core session of the Diabetes Prevention Program course virtually with [facility]. By the end of this session patients are able to complete the following objectives:   Virtual Visit via Video Note  I connected with Tara Carr by a video enabled application and verified that I am speaking with the correct person using two identifiers.  Location: Patient: Home.  Provider: Office.   Learning Objectives: Counter self-defeating thoughts with positive self-statements Define assertiveness.  List examples of ways to practice assertiveness.   Goals:  Record weight taken outside of class.  Track foods and beverages eaten each day in the "Food and Activity Tracker," including calories and fat grams for each item.   Track activity type, minutes you were active, and distance you reached each day in the "Food and Activity Tracker."   Follow-Up Plan: Attend next session.  Email completed "Food and Activity Trackers" before next session to be reviewed by Lifestyle Coach.

## 2021-05-25 ENCOUNTER — Ambulatory Visit (HOSPITAL_BASED_OUTPATIENT_CLINIC_OR_DEPARTMENT_OTHER): Payer: Medicare Other | Admitting: Registered"

## 2021-05-25 DIAGNOSIS — R7303 Prediabetes: Secondary | ICD-10-CM | POA: Diagnosis not present

## 2021-06-27 NOTE — Progress Notes (Signed)
On 05/25/21 pt completed a post core session of the Diabetes Prevention Program course virtually with [facility]. By the end of this session patients are able to complete the following objectives:   Virtual Visit via Video Note  I connected with Tara Carr by a video enabled application and verified that I am speaking with the correct person using two identifiers.  Location: Patient: Home.  Provider: Office.   Learning Objectives: Reflect on lifestyle changes they have made since starting the DPP.  Set long-term goals to promote continued maintenance of lifestyle changes made during the program.   Goals:  Work toward reaching new long-term goals set during class.   Follow-Up Plan: Contact Lifestyle Coach with questions/concerns PRN.

## 2021-09-18 ENCOUNTER — Encounter: Payer: Self-pay | Admitting: Internal Medicine

## 2021-10-19 ENCOUNTER — Ambulatory Visit (AMBULATORY_SURGERY_CENTER): Payer: Self-pay | Admitting: *Deleted

## 2021-10-19 VITALS — Ht 62.0 in | Wt 159.4 lb

## 2021-10-19 DIAGNOSIS — Z1211 Encounter for screening for malignant neoplasm of colon: Secondary | ICD-10-CM

## 2021-10-19 NOTE — Progress Notes (Signed)
bhbhbbNo egg or soy allergy known to patient  No issues known to pt with past sedation with any surgeries or procedures Patient denies ever being told they had issues or difficulty with intubation  No FH of Malignant Hyperthermia Pt is not on diet pills Pt is not on  home 02  Pt is not on blood thinners  Pt denies issues with constipation  No A fib or A flutter Have any cardiac testing pending--pt denies

## 2021-11-13 HISTORY — PX: COLONOSCOPY W/ POLYPECTOMY: SHX1380

## 2021-11-13 HISTORY — PX: ESOPHAGOGASTRODUODENOSCOPY (EGD) WITH ESOPHAGEAL DILATION: SHX5812

## 2021-11-16 ENCOUNTER — Encounter: Payer: Self-pay | Admitting: Certified Registered Nurse Anesthetist

## 2021-11-16 ENCOUNTER — Telehealth: Payer: Self-pay | Admitting: Internal Medicine

## 2021-11-16 ENCOUNTER — Encounter: Payer: Self-pay | Admitting: *Deleted

## 2021-11-16 NOTE — Telephone Encounter (Signed)
Dr.Gessner,  Patient called today concerned, between July 11-17 she had 2 episodes of food getting stuck in her esophagus. She could not get water down, she finally was able to get this up with bloody mucus and it was painful. She does have h/o GERD, H.H. and dysphagia per xray in 2016. Patient request EGD with her colonoscopy that is scheduled on 11/23/2021. I did explain that we may need to reschedule in order to have both procedures done. Please advise. Thank you, Niala Stcharles PV

## 2021-11-16 NOTE — Telephone Encounter (Signed)
If we block the 1130 that day can make her a double  I want her to hold her Fosamax - I think that might be contributing to this

## 2021-11-16 NOTE — Telephone Encounter (Signed)
Patient was so thankful and so happy to do the ECL. Pt aware to stop taking the Fosamax. EGD added to her 8 am appointment. 1130 blocked.

## 2021-11-16 NOTE — Telephone Encounter (Signed)
Inbound call from patient stating that she has some information that she needed to give. Requesting a call back to discuss. Please advise.

## 2021-11-16 NOTE — Telephone Encounter (Signed)
error 

## 2021-11-22 ENCOUNTER — Telehealth: Payer: Self-pay | Admitting: Internal Medicine

## 2021-11-22 NOTE — Telephone Encounter (Signed)
Returned pt's call. Answered all questions about medications. Instructed pt to not take any medications with red coating. Pt verbalized understanding.

## 2021-11-22 NOTE — Telephone Encounter (Signed)
PT has a colonoscopy tomorrow and wants to know if she can take an OTC decongestant that is coated in a red film. Please reach out to advise. Thank you

## 2021-11-23 ENCOUNTER — Ambulatory Visit (AMBULATORY_SURGERY_CENTER): Payer: Medicare Other | Admitting: Internal Medicine

## 2021-11-23 ENCOUNTER — Encounter: Payer: Self-pay | Admitting: Internal Medicine

## 2021-11-23 VITALS — BP 106/67 | HR 61 | Temp 98.7°F | Resp 12 | Ht 62.0 in | Wt 159.4 lb

## 2021-11-23 DIAGNOSIS — D120 Benign neoplasm of cecum: Secondary | ICD-10-CM

## 2021-11-23 DIAGNOSIS — Z1211 Encounter for screening for malignant neoplasm of colon: Secondary | ICD-10-CM | POA: Diagnosis not present

## 2021-11-23 DIAGNOSIS — R1310 Dysphagia, unspecified: Secondary | ICD-10-CM | POA: Diagnosis not present

## 2021-11-23 DIAGNOSIS — D125 Benign neoplasm of sigmoid colon: Secondary | ICD-10-CM | POA: Diagnosis not present

## 2021-11-23 DIAGNOSIS — K222 Esophageal obstruction: Secondary | ICD-10-CM

## 2021-11-23 DIAGNOSIS — D123 Benign neoplasm of transverse colon: Secondary | ICD-10-CM

## 2021-11-23 MED ORDER — OMEPRAZOLE 20 MG PO CPDR: 20.0000 mg | DELAYED_RELEASE_CAPSULE | Freq: Every day | ORAL | 3 refills | Status: DC

## 2021-11-23 MED ORDER — SODIUM CHLORIDE 0.9 % IV SOLN: 500.0000 mL | Freq: Once | INTRAVENOUS | Status: DC

## 2021-11-23 NOTE — Op Note (Signed)
[facility] Patient Name: Tara Carr Procedure Date: 11/23/2021 8:06 AM MRN: [id number] Endoscopist: Gatha Mayer , MD Age: 66 Referring MD:  Date of Birth: 03/29/1956 Gender: Female Account #: [id number] Procedure:                Colonoscopy Indications:              Screening for colorectal malignant neoplasm, This                            is the patient's first colonoscopy Medicines:                Monitored Anesthesia Care Procedure:                Pre-Anesthesia Assessment:                           - Prior to the procedure, a History and Physical                            was performed, and patient medications and                            allergies were reviewed. The patient's tolerance of                            previous anesthesia was also reviewed. The risks                            and benefits of the procedure and the sedation                            options and risks were discussed with the patient.                            All questions were answered, and informed consent                            was obtained. Prior Anticoagulants: The patient has                            taken no previous anticoagulant or antiplatelet                            agents. ASA Grade Assessment: II - A patient with                            mild systemic disease. After reviewing the risks                            and benefits, the patient was deemed in                            satisfactory condition to undergo the procedure.  After obtaining informed consent, the colonoscope                            was passed under direct vision. Throughout the                            procedure, the patient's blood pressure, pulse, and                            oxygen saturations were monitored continuously. The                            CF [id number] #[id number] was introduced through the anus                            and advanced to the  the cecum, identified by                            appendiceal orifice and ileocecal valve. The                            colonoscopy was somewhat difficult due to                            significant looping. Successful completion of the                            procedure was aided by applying abdominal pressure.                            The patient tolerated the procedure well. The                            quality of the bowel preparation was excellent. The                            bowel preparation used was Miralax via split dose                            instruction. Scope In: 8:19:11 AM Scope Out: 8:37:33 AM Scope Withdrawal Time: 0 hours 13 minutes 5 seconds  Total Procedure Duration: 0 hours 18 minutes 22 seconds  Findings:                 The perianal and digital rectal examinations were                            normal.                           Two sessile polyps were found in the sigmoid colon                            and distal transverse colon. The polyps were  diminutive in size. These polyps were removed with                            a cold snare. Resection and retrieval were                            complete. Verification of patient identification                            for the specimen was done. Estimated blood loss was                            minimal.                           A 1 mm polyp was found in the cecum. The polyp was                            sessile. The polyp was removed with a cold biopsy                            forceps. Resection and retrieval were complete.                            Verification of patient identification for the                            specimen was done. Estimated blood loss was minimal.                           A few small-mouthed diverticula were found in the                            sigmoid colon.                           The exam was otherwise without abnormality on                             direct and retroflexion views. Complications:            No immediate complications. Estimated Blood Loss:     Estimated blood loss was minimal. Impression:               - Two diminutive polyps in the sigmoid colon and in                            the distal transverse colon, removed with a cold                            snare. Resected and retrieved.                           - One 1 mm polyp in the cecum, removed with a cold  biopsy forceps. Resected and retrieved.                           - Diverticulosis in the sigmoid colon.                           - The examination was otherwise normal on direct                            and retroflexion views. Recommendation:           - Patient has a contact number available for                            emergencies. The signs and symptoms of potential                            delayed complications were discussed with the                            patient. Return to normal activities tomorrow.                            Written discharge instructions were provided to the                            patient.                           - Clear liquids x 1 hour then soft foods rest of                            day. Start prior diet tomorrow.                           - Patient has a contact number available for                            emergencies. The signs and symptoms of potential                            delayed complications were discussed with the                            patient. Return to normal activities tomorrow.                            Written discharge instructions were provided to the                            patient.                           - Repeat colonoscopy is recommended. The  colonoscopy date will be determined after pathology                            results from today's exam become available for                             review. Gatha Mayer, MD 11/23/2021 8:50:05 AM This report has been signed electronically.

## 2021-11-23 NOTE — Op Note (Signed)
[facility] Patient Name: Tara Carr Procedure Date: 11/23/2021 8:06 AM MRN: [id number] Endoscopist: Gatha Mayer , MD Age: 66 Referring MD:  Date of Birth: 1955-11-10 Gender: Female Account #: [id number] Procedure:                Upper GI endoscopy Indications:              Dysphagia Medicines:                Monitored Anesthesia Care Procedure:                Pre-Anesthesia Assessment:                           - Prior to the procedure, a History and Physical                            was performed, and patient medications and                            allergies were reviewed. The patient's tolerance of                            previous anesthesia was also reviewed. The risks                            and benefits of the procedure and the sedation                            options and risks were discussed with the patient.                            All questions were answered, and informed consent                            was obtained. Prior Anticoagulants: The patient has                            taken no previous anticoagulant or antiplatelet                            agents. ASA Grade Assessment: II - A patient with                            mild systemic disease. After reviewing the risks                            and benefits, the patient was deemed in                            satisfactory condition to undergo the procedure.                           After obtaining informed consent, the endoscope was  passed under direct vision. Throughout the                            procedure, the patient's blood pressure, pulse, and                            oxygen saturations were monitored continuously. The                            Endoscope was introduced through the mouth, and                            advanced to the second part of duodenum. The upper                            GI endoscopy was accomplished without  difficulty.                            The patient tolerated the procedure well. Scope In: Scope Out: Findings:                 One benign-appearing, intrinsic moderate                            (circumferential scarring or stenosis; an endoscope                            may pass) stenosis was found at the                            gastroesophageal junction. This stenosis measured                            1.6 cm (inner diameter) x less than one cm (in                            length). The stenosis was traversed. A TTS dilator                            was passed through the scope. Dilation with an                            18-19-20 mm balloon dilator was performed to 18 mm.                            The dilation site was examined and showed mild                            mucosal disruption. Estimated blood loss was                            minimal.                           The  gastroesophageal flap valve was visualized                            endoscopically and classified as Hill Grade IV (no                            fold, wide open lumen, hiatal hernia present).                           A 5 cm hiatal hernia was present.                           The exam was otherwise without abnormality.                           The cardia and gastric fundus were normal on                            retroflexion. Complications:            No immediate complications. Estimated Blood Loss:     Estimated blood loss was minimal. Impression:               - Benign-appearing esophageal stenosis. Dilated.                           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                           - 5 cm hiatal hernia.                           - The examination was otherwise normal.                           - No specimens collected. Recommendation:           -  Patient has a contact number available for                            emergencies. The signs and symptoms of potential                            delayed complications were discussed with the                            patient. Return to normal activities tomorrow.                            Written discharge instructions were provided to the  patient.                           - Clear liquids x 1 hour then soft foods rest of                            day. Start prior diet tomorrow.                           - Continue present medications.                           - Await pathology results.                           - Start omeprazole 20 mg qd - think appropriate to                            reduce reisk of recuurent stricture. Gatha Mayer, MD 11/23/2021 8:47:02 AM This report has been signed electronically.

## 2021-11-23 NOTE — Progress Notes (Signed)
0804 Robinul 0.1 mg IV given due large amount of secretions upon assessment.  MD made aware, vss 

## 2021-11-23 NOTE — Progress Notes (Signed)
Vital signs checked by:CW  The patient states no changes in medical or surgical history since pre-visit screening on 10/19/21.

## 2021-11-23 NOTE — Progress Notes (Signed)
Called to room to assist during endoscopic procedure.  Patient ID and intended procedure confirmed with present staff. Received instructions for my participation in the procedure from the performing physician.  

## 2021-11-23 NOTE — Patient Instructions (Addendum)
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 Should fix your swallowing problems. Also saw hiatal hernia.  To reduce risk of future problems I recommend you take omeprazole every day - prescription sent to pharmacy.  OK to resatart Fosamax.  Please ignore any statements about bone issues, etc. The science behind that is terrible.  There is much written and talked about possible health problems with the use of PPI medications (omeprazole (Prilosec), esomeprazole (Nexium), rabeprazole (Aciphex), lansoprazole (Prevacid), Dexlansoprazole (Dexilant) and pantoprazole ( Protonix).  The truth is the studies that suggest health problems with the use of PPI medications only find a weak association at best with the possible diseases reported to occur like dementia, bone fracture and kidney failure and even death. Nany sicker patients with multiple health problems end up on these medications for various reasons. Establishing cause and effect in medical research is extremely difficult and though I believe there are very rare occurrences in which PPI's do cause harm in these ways, the benefits of you taking your PPI outweigh these largely theoretical and sometimes directly disproven risks.  As always, I/we will strive to have you on the lowest effective dose and frequency of a medication to keep you well and with a good quality of life.    I found and removed 3 tiny colon polyps, look benign. I will let you know pathology results and when to have another routine colonoscopy by mail and/or My Chart.  You also have a condition called diverticulosis - common and not usually a problem. Please read the handout provided.  I appreciate the opportunity to care for you. Gatha Mayer, MD, [facility]   Clear liquids until 10:00 am, soft foods for the rest of today. Tomorrow you may resume normal diet.  YOU HAD AN ENDOSCOPIC PROCEDURE TODAY AT [facility]:   Refer to the procedure  report that was given to you for any specific questions about what was found during the examination.  If the procedure report does not answer your questions, please call your gastroenterologist to clarify.  If you requested that your care partner not be given the details of your procedure findings, then the procedure report has been included in a sealed envelope for you to review at your convenience later.  YOU SHOULD EXPECT: Some feelings of bloating in the abdomen. Passage of more gas than usual.  Walking can help get rid of the air that was put into your GI tract during the procedure and reduce the bloating. If you had a lower endoscopy (such as a colonoscopy or flexible sigmoidoscopy) you may notice spotting of blood in your stool or on the toilet paper. If you underwent a bowel prep for your procedure, you may not have a normal bowel movement for a few days.  Please Note:  You might notice some irritation and congestion in your nose or some drainage.  This is from the oxygen used during your procedure.  There is no need for concern and it should clear up in a day or so.  SYMPTOMS TO REPORT IMMEDIATELY:  Following lower endoscopy (colonoscopy or flexible sigmoidoscopy):  Excessive amounts of blood in the stool  Significant tenderness or worsening of abdominal pains  Swelling of the abdomen that is new, acute  Fever of 100F or higher  Following upper endoscopy (EGD)  Vomiting of blood or coffee ground material  New chest pain or pain under the shoulder blades  Painful or persistently difficult swallowing  New  shortness of breath  Fever of 100F or higher  Black, tarry-looking stools  For urgent or emergent issues, a gastroenterologist can be reached at any hour by calling [phone]. Do not use MyChart messaging for urgent concerns.    DIET:  Clear liquids until 10:00 am, soft foods for the rest of today, tomorrrow you may proceed to your regular diet.  Drink plenty of fluids but  you should avoid alcoholic beverages for 24 hours.  ACTIVITY:  You should plan to take it easy for the rest of today and you should NOT DRIVE or use heavy machinery until tomorrow (because of the sedation medicines used during the test).    FOLLOW UP: Our staff will call the number listed on your records the next business day following your procedure.  We will call around 7:15- 8:00 am to check on you and address any questions or concerns that you may have regarding the information given to you following your procedure. If we do not reach you, we will leave a message.  If you develop any symptoms (ie: fever, flu-like symptoms, shortness of breath, cough etc.) before then, please call [phone].  If you test positive for Covid 19 in the 2 weeks post procedure, please call and report this information to [place].    If any biopsies were taken you will be contacted by phone or by letter within the next 1-3 weeks.  Please call us at [phone] if you have not heard about the biopsies in 3 weeks.    SIGNATURES/CONFIDENTIALITY: You and/or your care partner have signed paperwork which will be entered into your electronic medical record.  These signatures attest to the fact that that the information above on your After Visit Summary has been reviewed and is understood.  Full responsibility of the confidentiality of this discharge information lies with you and/or your care-partner.

## 2021-11-23 NOTE — Progress Notes (Signed)
[facility] History and Physical   Primary Care Physician:  Cari Caraway, MD   Reason for Procedure:   Dysphagia, colon cancer screening  Plan:    EGD, dilate esophagus, colonoscopy     HPI: Tara Carr is a 66 y.o. female w/ c/o dysphagia and for screening colonoscopy   Past Medical History:  Diagnosis Date   Allergy    Anxiety    Arthritis    Cataract    forming   Depression    GERD (gastroesophageal reflux disease)    Hyperlipidemia    Neuromuscular disorder ([place])    hiatal hernia   Osteoporosis    PONV (postoperative nausea and vomiting)    Prediabetes     Past Surgical History:  Procedure Laterality Date   laproscopy      Prior to Admission medications   Medication Sig Start Date End Date Taking? Authorizing Provider  atorvastatin (LIPITOR) 10 MG tablet Take 10 mg by mouth daily. 10/06/21  Yes [provider]  Calcium 250 MG CAPS 3 tabs   Yes [provider]  Vitamin D, Ergocalciferol, 50 MCG (2000 UT) CAPS 1 capsule   Yes [provider]  alendronate (FOSAMAX) 70 MG tablet Take 70 mg by mouth once a week. 09/22/21   [provider]  pseudoephedrine (SUDAFED) 30 MG tablet 1 capsule    [provider]    Current Outpatient Medications  Medication Sig Dispense Refill   atorvastatin (LIPITOR) 10 MG tablet Take 10 mg by mouth daily.     Calcium 250 MG CAPS 3 tabs     Vitamin D, Ergocalciferol, 50 MCG (2000 UT) CAPS 1 capsule     alendronate (FOSAMAX) 70 MG tablet Take 70 mg by mouth once a week.     pseudoephedrine (SUDAFED) 30 MG tablet 1 capsule     Current Facility-Administered Medications  Medication Dose Route Frequency Provider Last Rate Last Admin   0.9 %  sodium chloride infusion  500 mL Intravenous Once Gatha Mayer, MD        Allergies as of 11/23/2021   (No Known Allergies)    Family History  Problem Relation Age of Onset   Colon cancer Neg Hx    Colon polyps Neg Hx     Esophageal cancer Neg Hx    Stomach cancer Neg Hx    Rectal cancer Neg Hx     Social History   Socioeconomic History   Marital status: Divorced    Spouse name: Not on file   Number of children: Not on file   Years of education: Not on file   Highest education level: Not on file  Occupational History   Not on file  Tobacco Use   Smoking status: Never   Smokeless tobacco: Never  Vaping Use   Vaping Use: Never used  Substance and Sexual Activity   Alcohol use: Yes    Comment: OCC  maybe once a month   Drug use: Never   Sexual activity: Not on file  Other Topics Concern   Not on file  Social History Narrative   Not on file   Social Determinants of Health   Financial Resource Strain: Not on file  Food Insecurity: Not on file  Transportation Needs: Not on file  Physical Activity: Not on file  Stress: Not on file  Social Connections: Not on file  Intimate Partner Violence: Not on file    Review of Systems:  All other review of systems negative except  as mentioned in the HPI.  Physical Exam: Vital signs BP (!) 148/94   Pulse 86   Temp 98.7 F (37.1 C)   Ht '5\' 2"'$  (1.575 m)   Wt 159 lb 6.4 oz (72.3 kg)   SpO2 98%   BMI 29.15 kg/m   General:   Alert,  Well-developed, well-nourished, pleasant and cooperative in NAD Lungs:  Clear throughout to auscultation.   Heart:  Regular rate and rhythm; no murmurs, clicks, rubs,  or gallops. Abdomen:  Soft, nontender and nondistended. Normal bowel sounds.   Neuro/Psych:  Alert and cooperative. Normal mood and affect. A and O x 3   '@Charlean Carneal'$  Simonne Maffucci, MD, [facility] [phone] (pager) 11/23/2021 8:00 AM@

## 2021-11-26 ENCOUNTER — Telehealth: Payer: Self-pay | Admitting: *Deleted

## 2021-11-26 NOTE — Telephone Encounter (Signed)
  Follow up Call-     11/23/2021    7:30 AM  Call back number  Post procedure Call Back phone  # [phone]  Permission to leave phone message Yes     Patient questions:  Do you have a fever, pain , or abdominal swelling? No. Pain Score  0 *  Have you tolerated food without any problems? Yes.    Have you been able to return to your normal activities? Yes.    Do you have any questions about your discharge instructions: Diet   No. Medications  No. Follow up visit  No.  Do you have questions or concerns about your Care? No.  Actions: * If pain score is 4 or above: No action needed, pain <4.

## 2021-11-29 ENCOUNTER — Encounter: Payer: Self-pay | Admitting: Internal Medicine

## 2021-11-29 DIAGNOSIS — K222 Esophageal obstruction: Secondary | ICD-10-CM

## 2021-11-29 DIAGNOSIS — Z860101 Personal history of adenomatous and serrated colon polyps: Secondary | ICD-10-CM

## 2021-11-29 DIAGNOSIS — Z86010 Personal history of colonic polyps: Secondary | ICD-10-CM

## 2021-11-29 HISTORY — DX: Personal history of adenomatous and serrated colon polyps: Z86.0101

## 2021-11-29 HISTORY — DX: Esophageal obstruction: K22.2

## 2022-03-19 ENCOUNTER — Other Ambulatory Visit: Payer: Self-pay | Admitting: Family Medicine

## 2022-03-19 DIAGNOSIS — Z1231 Encounter for screening mammogram for malignant neoplasm of breast: Secondary | ICD-10-CM

## 2022-05-17 ENCOUNTER — Ambulatory Visit
Admission: RE | Admit: 2022-05-17 | Discharge: 2022-05-17 | Disposition: A | Discharge status: Home / Self Care | Payer: Medicare Other | Source: Ambulatory Visit | Attending: Family Medicine | Admitting: Family Medicine

## 2022-05-17 DIAGNOSIS — Z1231 Encounter for screening mammogram for malignant neoplasm of breast: Secondary | ICD-10-CM

## 2022-10-23 ENCOUNTER — Other Ambulatory Visit: Payer: Self-pay | Admitting: Pain Medicine

## 2022-10-23 DIAGNOSIS — M810 Age-related osteoporosis without current pathological fracture: Secondary | ICD-10-CM

## 2022-10-27 ENCOUNTER — Other Ambulatory Visit: Payer: Self-pay | Admitting: Internal Medicine

## 2022-11-17 IMAGING — MG MM DIGITAL SCREENING BILAT W/ TOMO AND CAD
8 series · 9 of 24 positions shown · non-contrast
Comparison: Previous exam(s).

CLINICAL DATA: Screening.

EXAM:
DIGITAL SCREENING BILATERAL MAMMOGRAM WITH TOMOSYNTHESIS AND CAD
TECHNIQUE: Bilateral screening digital craniocaudal and mediolateral oblique
mammograms were obtained. Bilateral screening digital breast
tomosynthesis was performed. The images were evaluated with
computer-aided detection.

[R MLO synth-2D]
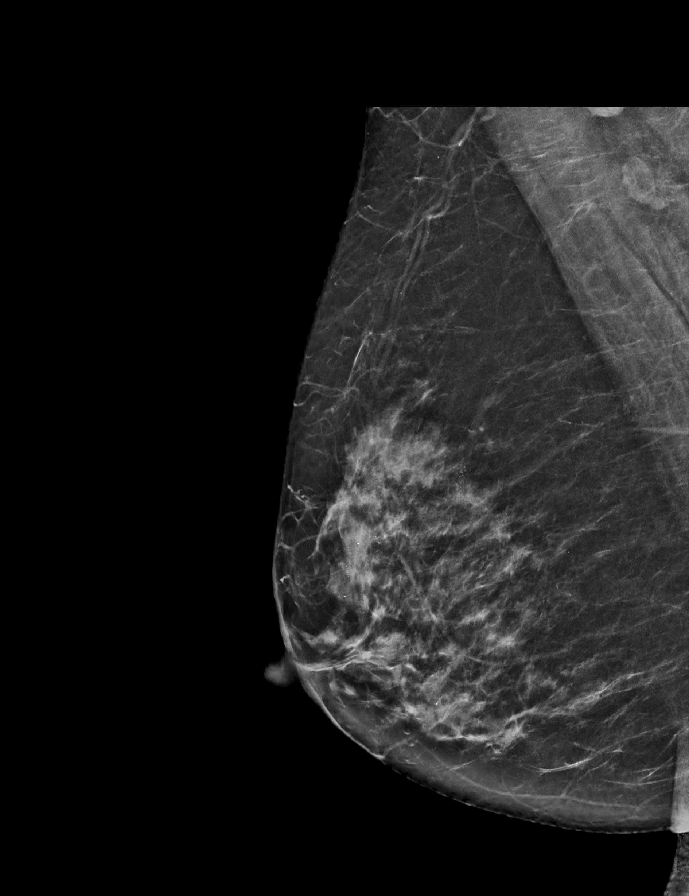

[L CC synth-2D]
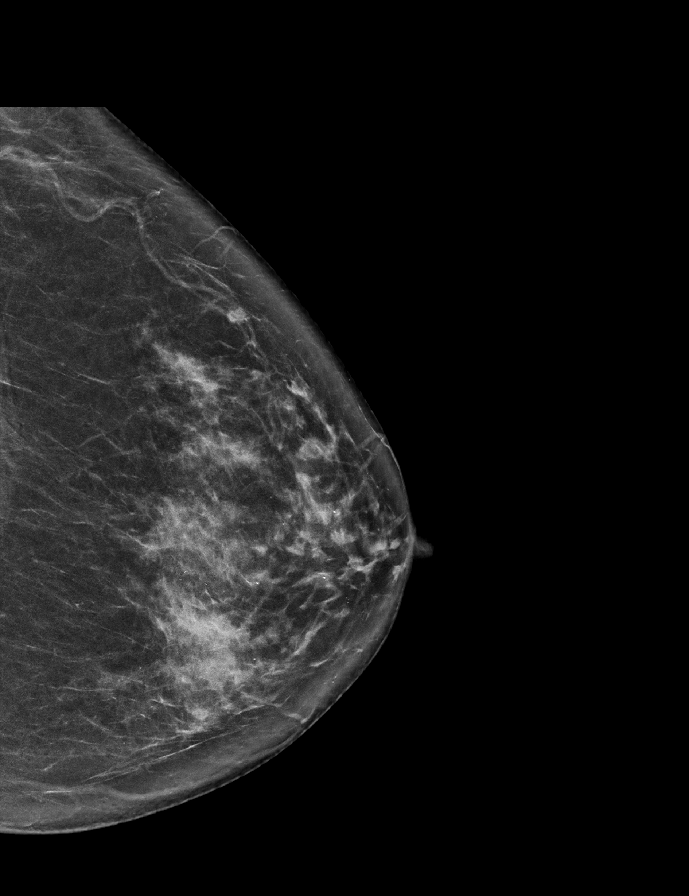

[L MLO synth-2D]
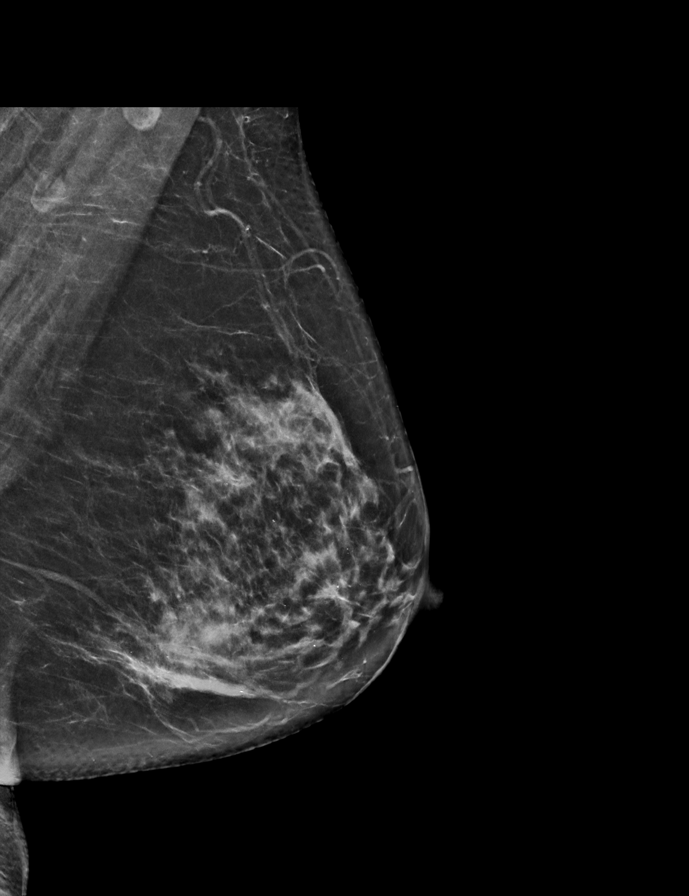

[R CC synth-2D]
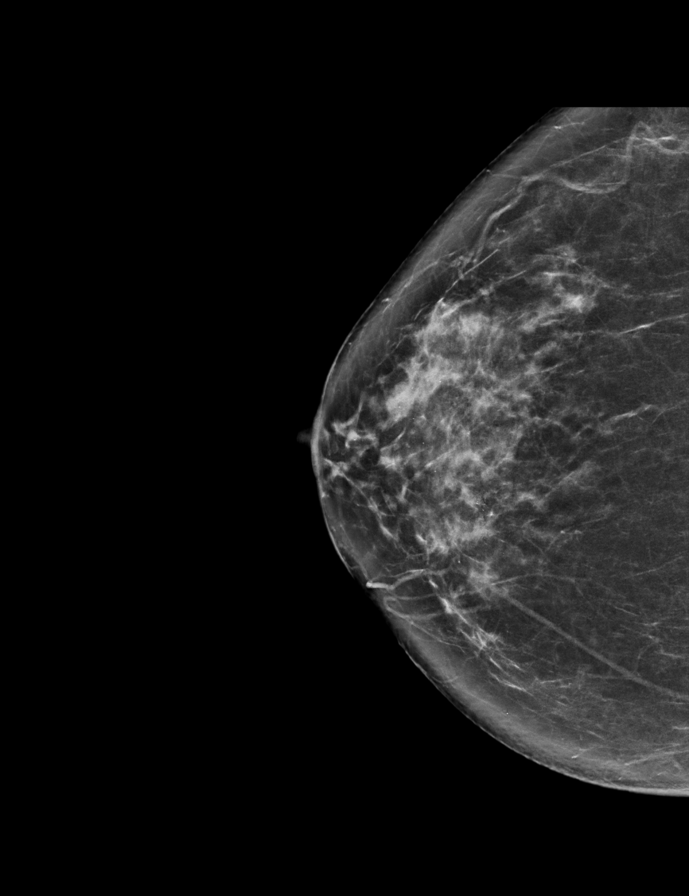

[R MLO tomo · 2 of 67 frames shown]
[frame 22/67]
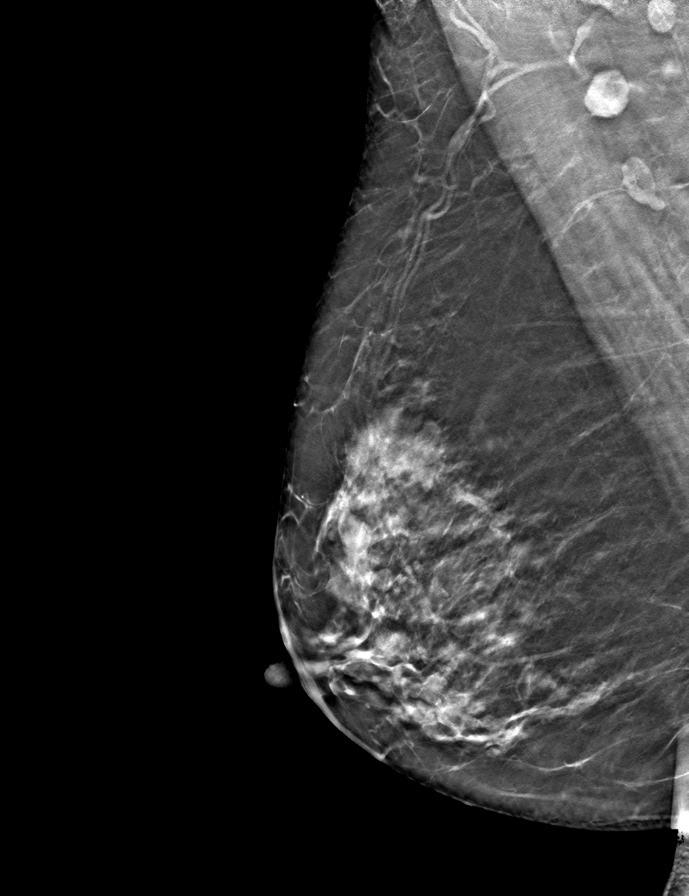
[frame 34/67]
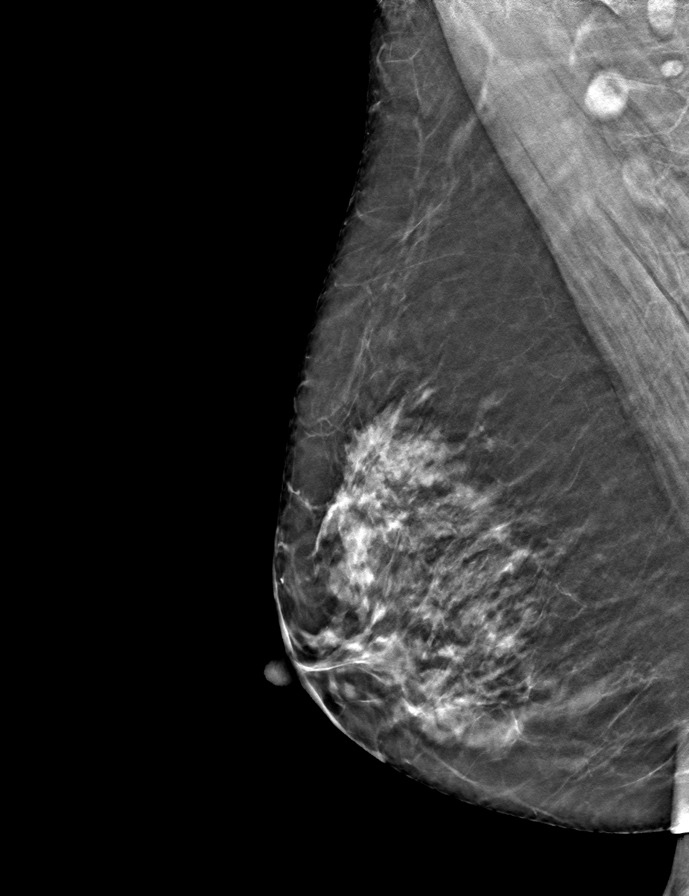

[R CC tomo · tomo slice 35/68.0]
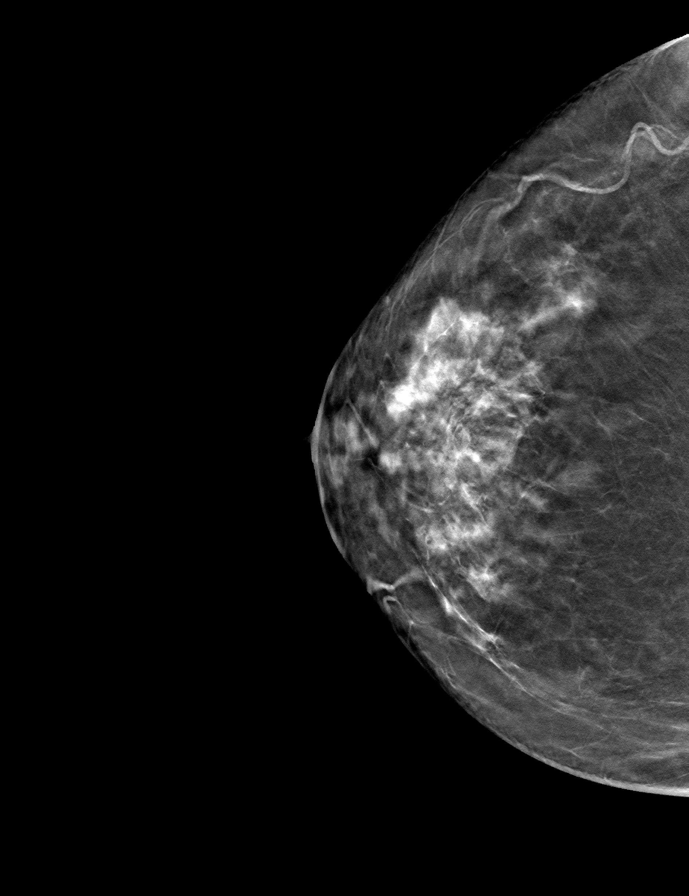

[L CC tomo · tomo slice 33/66.0]
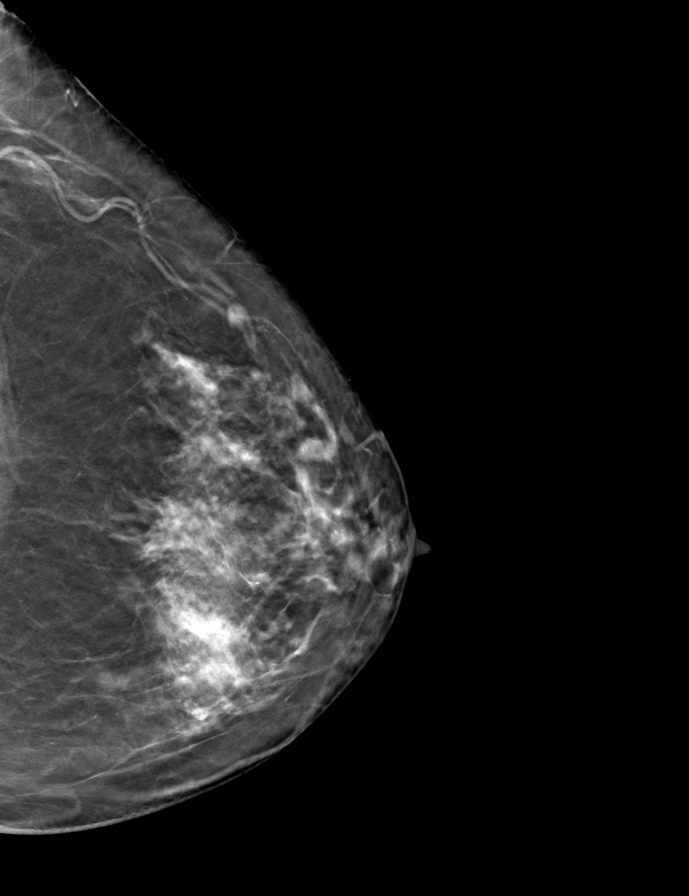

[L MLO tomo · tomo slice 36/71.0]
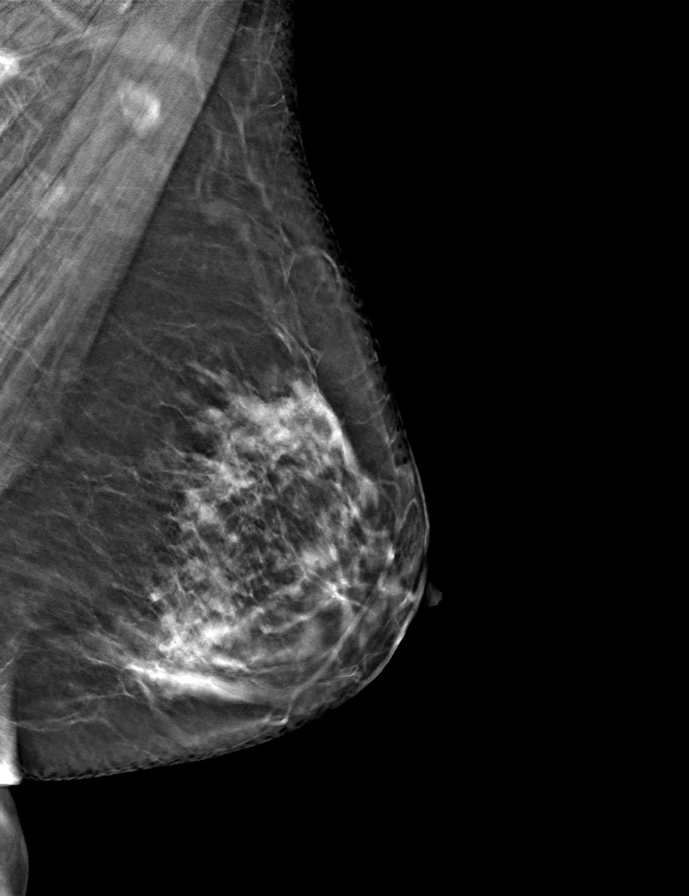

[9 of 24 positions shown; findings below may reference images not displayed]

ACR Breast Density Category b: There are scattered areas of
fibroglandular density.
FINDINGS: There are no findings suspicious for malignancy. The images were
evaluated with computer-aided detection.
IMPRESSION: No mammographic evidence of malignancy. A result letter of this
screening mammogram will be mailed directly to the patient.

RECOMMENDATION:
Screening mammogram in one year. (Code:[id number])

BI-RADS CATEGORY  1: Negative.

## 2022-12-24 ENCOUNTER — Other Ambulatory Visit: Payer: Self-pay | Admitting: Pain Medicine

## 2022-12-24 DIAGNOSIS — N644 Mastodynia: Secondary | ICD-10-CM

## 2022-12-24 DIAGNOSIS — N6489 Other specified disorders of breast: Secondary | ICD-10-CM

## 2022-12-30 ENCOUNTER — Ambulatory Visit: Payer: Medicare Other

## 2022-12-30 ENCOUNTER — Ambulatory Visit
Admission: RE | Admit: 2022-12-30 | Discharge: 2022-12-30 | Disposition: A | Discharge status: Home / Self Care | Payer: Medicare Other | Source: Ambulatory Visit | Attending: Pain Medicine | Admitting: Pain Medicine

## 2022-12-30 DIAGNOSIS — N644 Mastodynia: Secondary | ICD-10-CM

## 2022-12-30 DIAGNOSIS — N6489 Other specified disorders of breast: Secondary | ICD-10-CM

## 2023-01-20 ENCOUNTER — Other Ambulatory Visit: Payer: Self-pay | Admitting: Internal Medicine

## 2023-04-15 ENCOUNTER — Other Ambulatory Visit: Payer: Self-pay | Admitting: Internal Medicine

## 2023-04-25 ENCOUNTER — Other Ambulatory Visit (HOSPITAL_COMMUNITY): Payer: Self-pay | Admitting: Family Medicine

## 2023-04-25 DIAGNOSIS — E782 Mixed hyperlipidemia: Secondary | ICD-10-CM

## 2023-05-01 ENCOUNTER — Ambulatory Visit (HOSPITAL_COMMUNITY)
Admission: RE | Admit: 2023-05-01 | Discharge: 2023-05-01 | Disposition: A | Discharge status: Home / Self Care | Payer: Self-pay | Source: Ambulatory Visit | Attending: Family Medicine | Admitting: Family Medicine

## 2023-05-01 DIAGNOSIS — E782 Mixed hyperlipidemia: Secondary | ICD-10-CM | POA: Insufficient documentation

## 2023-05-15 ENCOUNTER — Other Ambulatory Visit (HOSPITAL_COMMUNITY): Payer: Self-pay | Admitting: Family Medicine

## 2023-05-15 DIAGNOSIS — R160 Hepatomegaly, not elsewhere classified: Secondary | ICD-10-CM

## 2023-05-19 ENCOUNTER — Ambulatory Visit (HOSPITAL_COMMUNITY)
Admission: RE | Admit: 2023-05-19 | Discharge: 2023-05-19 | Disposition: A | Discharge status: Home / Self Care | Payer: Medicare Other | Source: Ambulatory Visit | Attending: Family Medicine | Admitting: Family Medicine

## 2023-05-19 DIAGNOSIS — R160 Hepatomegaly, not elsewhere classified: Secondary | ICD-10-CM | POA: Insufficient documentation

## 2023-05-23 ENCOUNTER — Ambulatory Visit
Admission: RE | Admit: 2023-05-23 | Discharge: 2023-05-23 | Disposition: A | Discharge status: Home / Self Care | Payer: Medicare Other | Source: Ambulatory Visit | Attending: Pain Medicine | Admitting: Pain Medicine

## 2023-05-23 DIAGNOSIS — M810 Age-related osteoporosis without current pathological fracture: Secondary | ICD-10-CM

## 2023-06-01 ENCOUNTER — Other Ambulatory Visit: Payer: Self-pay | Admitting: Internal Medicine

## 2023-08-06 ENCOUNTER — Ambulatory Visit (INDEPENDENT_AMBULATORY_CARE_PROVIDER_SITE_OTHER): Payer: Medicare Other | Admitting: Internal Medicine

## 2023-08-06 ENCOUNTER — Encounter: Payer: Self-pay | Admitting: Internal Medicine

## 2023-08-06 VITALS — BP 132/66 | HR 70 | Ht 62.0 in | Wt 170.0 lb

## 2023-08-06 DIAGNOSIS — R194 Change in bowel habit: Secondary | ICD-10-CM

## 2023-08-06 DIAGNOSIS — K219 Gastro-esophageal reflux disease without esophagitis: Secondary | ICD-10-CM | POA: Diagnosis not present

## 2023-08-06 DIAGNOSIS — R195 Other fecal abnormalities: Secondary | ICD-10-CM

## 2023-08-06 DIAGNOSIS — Z860101 Personal history of adenomatous and serrated colon polyps: Secondary | ICD-10-CM

## 2023-08-06 DIAGNOSIS — R1013 Epigastric pain: Secondary | ICD-10-CM | POA: Diagnosis not present

## 2023-08-06 DIAGNOSIS — K222 Esophageal obstruction: Secondary | ICD-10-CM | POA: Diagnosis not present

## 2023-08-06 MED ORDER — OMEPRAZOLE 20 MG PO CPDR
20.0000 mg | DELAYED_RELEASE_CAPSULE | Freq: Every day | ORAL | 3 refills | Status: AC
Start: 2023-08-06 — End: ?

## 2023-08-06 NOTE — Patient Instructions (Signed)
 We have sent the following medications to your pharmacy for you to pick up at your convenience: Omeprazole   Good luck with your move to [place].  I appreciate the opportunity to care for you. Loy Ruff, MD, [facility]

## 2023-08-06 NOTE — Progress Notes (Signed)
 DALAYLA ALDREDGE 68 y.o. [birth year] [id number]  Assessment & Plan:   Encounter Diagnoses  Name Primary?   GERD with stricture Yes   Hx of adenomatous colonic polyps    Change in consistency of stool    Abdominal pain, epigastric    She should stay on omeprazole .  This is refilled.  I explained this would greatly reduce the chance of needing esophageal dilation again.  Epigastric pain is fleeting intermittent without a clear pattern and positional.  Monitor.  Exam unrevealing today.  Pellet-like stools are tolerated.  She could increase fluid intake and fiber.  Recent colonoscopy reassuring.  Subjective:   Chief Complaint: GERD, epigastric pain and bowel movement change  HPI  Discussed the use of AI scribe software for clinical note transcription with the patient, who gave verbal consent to proceed.  JALIYA SIEGMANN is a 68 year old female who presents for a follow-up regarding omeprazole  use and esophageal dilation history.  She has been taking omeprazole  following an endoscopy where esophageal dilation was performed to treat dysphagia. The medication has been effective, as she no longer experiences swallowing difficulties and is content to continue taking omeprazole  to prevent recurrence of symptoms.  She describes experiencing a sporadic sensation in her abdomen that 'catches her breath' and feels like something is twisting or catching inside. This occurs infrequently, about once a month, and is not associated with any protrusion or pain.  She reports weight gain, which she attributes to anxiety and inactivity, and plans to address this over the summer. She is preparing to move to [place], [place] , to live with her daughter and son-in-law.  Her bowel movements are described as small, pellet-like, similar to 'rabbit pellets,' but she does not experience any difficulty with defecation. She notes that her bowel movements have become more regular recently. No  current swallowing issues, abdominal pain, or difficulty with defecation.      Colonoscopy 11/23/2021 - Two diminutive polyps in the sigmoid colon and in the distal transverse colon, removed with a cold snare. Resected and retrieved. - One 1 mm polyp in the cecum, removed with a cold biopsy forceps. Resected and retrieved. - Diverticulosis in the sigmoid colon. - The examination was otherwise normal on direct and retroflexion views. Polyps were adenomas plan repeat colonoscopy August 2028, approximately  EGD with esophageal dilation 11/23/2021 - Benign-appearing esophageal stenosis. Dilated. - Gastroesophageal flap valve classified as Hill Grade IV (no fold, wide open lumen, hiatal hernia present). - 5 cm hiatal hernia. - The examination was otherwise normal. - No specimens collected.  No Known Allergies Current Meds  Medication Sig   alendronate (FOSAMAX) 70 MG tablet Take 70 mg by mouth once a week.   atorvastatin (LIPITOR) 20 MG tablet Take 20 mg by mouth daily.   b complex vitamins capsule Take 1 capsule by mouth daily.   Calcium 250 MG CAPS 3 tabs   co-enzyme Q-10 30 MG capsule Take 30 mg by mouth 3 (three) times daily.   Omega-3 Fatty Acids (FISH OIL PO) Take 1 capsule by mouth daily.   Turmeric (QC TUMERIC COMPLEX) 500 MG CAPS Take 1 capsule by mouth daily.   Vitamin D , Ergocalciferol , 50 MCG (2000 UT) CAPS 1 capsule   [DISCONTINUED] omeprazole  (PRILOSEC) 20 MG capsule Take 1 capsule (20 mg total) by mouth daily. Patient needs follow up appointment for future refills. Please call [phone] to schedule an appointment.   Past Medical History:  Diagnosis Date   Allergy    Anxiety  Arthritis    Cataract    forming   Depression    Esophageal stricture 11/29/2021   Dilated 11/2021   GERD (gastroesophageal reflux disease)    Hx of adenomatous colonic polyps 11/29/2021   3 diminutive - recall 2028   Hyperlipidemia    Neuromuscular disorder ([facility])    hiatal hernia   Osteoporosis     PONV (postoperative nausea and vomiting)    Prediabetes    Past Surgical History:  Procedure Laterality Date   COLONOSCOPY W/ POLYPECTOMY  11/2021   3 adenomas recall 2028   ESOPHAGOGASTRODUODENOSCOPY (EGD) WITH ESOPHAGEAL DILATION  11/2021   laproscopy     Social History   Social History Narrative   Divorced has a daughter that lives in [place] , patient anticipates moving in with her in late 2025   family history is not on file.   Review of Systems As above  Objective:   Physical Exam BP 132/66   Pulse 70   Ht 5\' 2"  (1.575 m)   Wt 170 lb (77.1 kg)   BMI 31.09 kg/m   Abdominal exam, the patient standing I see no protrusion or hernia there is no tenderness.  There is no diastases recti.  Xiphoid intact without tenderness.

## 2023-09-10 NOTE — Progress Notes (Unsigned)
 [facility] [address] [place] [address] Phone: [phone] Subjective:   Tara Carr, am serving as a scribe for Dr. Ronnell Carr.  I'Carr seeing this patient by the request  of:  Tara Lobstein, MD  CC: knee pain and swelling  [id number]  Tara Carr is a 68 y.o. female coming in with complaint of R knee pain and swelling. Patient states that she injured knee 15 years ago shoveling snow. Unable to fully extend knee and knee stays swollen. Uses knee compression sleeves to mow lawn. Painful to go up and down stairs. Knee feels unsteady. Patient will move in October. Referred by Tara Carr and Tara Carr.      Past Medical History:  Diagnosis Date   Allergy    Anxiety    Arthritis    Cataract    forming   Depression    Esophageal stricture 11/29/2021   Dilated 11/2021   GERD (gastroesophageal reflux disease)    Hx of adenomatous colonic polyps 11/29/2021   3 diminutive - recall 2028   Hyperlipidemia    Neuromuscular disorder ([facility])    hiatal hernia   Osteoporosis    PONV (postoperative nausea and vomiting)    Prediabetes    Past Surgical History:  Procedure Laterality Date   COLONOSCOPY W/ POLYPECTOMY  11/2021   3 adenomas recall 2028   ESOPHAGOGASTRODUODENOSCOPY (EGD) WITH ESOPHAGEAL DILATION  11/2021   laproscopy     Social History   Socioeconomic History   Marital status: Divorced    Spouse name: Not on file   Number of children: Not on file   Years of education: Not on file   Highest education level: Not on file  Occupational History   Not on file  Tobacco Use   Smoking status: Never   Smokeless tobacco: Never  Vaping Use   Vaping status: Never Used  Substance and Sexual Activity   Alcohol use: Yes    Comment: OCC  maybe once a month   Drug use: Never   Sexual activity: Not on file  Other Topics Concern   Not on file  Social History Narrative   Divorced has a daughter that lives in [place]  [place] , patient anticipates moving in with her in late 2025   Social Drivers of Health   Financial Resource Strain: Not on file  Food Insecurity: Not on file  Transportation Needs: Not on file  Physical Activity: Not on file  Stress: Not on file  Social Connections: Not on file   No Known Allergies Family History  Problem Relation Age of Onset   Colon cancer Neg Hx    Colon polyps Neg Hx    Esophageal cancer Neg Hx    Stomach cancer Neg Hx    Rectal cancer Neg Hx     Current Outpatient Medications (Endocrine & Metabolic):    alendronate (FOSAMAX) 70 MG tablet, Take 70 mg by mouth once a week.  Current Outpatient Medications (Cardiovascular):    atorvastatin (LIPITOR) 20 MG tablet, Take 20 mg by mouth daily.     Current Outpatient Medications (Other):    b complex vitamins capsule, Take 1 capsule by mouth daily.   Calcium 250 MG CAPS, 3 tabs   co-enzyme Q-10 30 MG capsule, Take 30 mg by mouth 3 (three) times daily.   Omega-3 Fatty Acids (FISH OIL PO), Take 1 capsule by mouth daily.   omeprazole  (PRILOSEC) 20 MG capsule, Take 1 capsule (20 mg total) by  mouth daily.   Turmeric (QC TUMERIC COMPLEX) 500 MG CAPS, Take 1 capsule by mouth daily.   Vitamin D , Ergocalciferol , 50 MCG (2000 UT) CAPS, 1 capsule   Reviewed prior external information including notes and imaging from  primary care provider As well as notes that were available from care everywhere and other healthcare systems.  Past medical history, social, surgical and family history all reviewed in electronic medical record.  No pertanent information unless stated regarding to the chief complaint.   Review of Systems:  No headache, visual changes, nausea, vomiting, diarrhea, constipation, dizziness, abdominal pain, skin rash, fevers, chills, night sweats, weight loss, swollen lymph nodes, body aches, joint swelling, chest pain, shortness of breath, mood changes. POSITIVE muscle aches  Objective  There were no  vitals taken for this visit.   General: No apparent distress alert and oriented x3 mood and affect normal, dressed appropriately.  HEENT: Pupils equal, extraocular movements intact  Respiratory: Patient's speak in full sentences and does not appear short of breath  Cardiovascular: No lower extremity edema, non tender, no erythema  Knee exam shows patient does have some crepitus noted.  Some lateral tracking of the kneecap noted.  Does have some limited extension of 5 degrees on the right knee compared to the left.  Patient does have a positive patellar grind test noted.  Mild instability with valgus and varus force.  Limited muscular skeletal ultrasound was performed and interpreted by Tara Carr, Carr  Severe narrowing of the patellofemoral joint especially laterally.  Nearly bone-on-bone.  Hypoechoic changes consistent with a chronic effusion.  Mild calcific changes that is consistent with some mild loose bodies.  The medial meniscus does have some degenerative changes with moderate narrowing of the medial compartment.  Patient does have what appears to be calcific changes of the meniscus. 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  This included exercises focusing on stretching, strengthening, with significant focus on eccentric aspects.   Long term goals include an improvement in range of motion, strength, endurance as well as avoiding reinjury. Patient's frequency would include in 1-2 times a day, 3-5 times a week for a duration of 6-12 weeks. Reviewed anatomy using anatomical model and how PFS occurs.  Given rehab exercises handout for VMO, hip abductors, core, entire kinetic chain including proprioception exercises.  Could benefit from PT, regular exercise, upright biking, and a PFS knee brace to assist with tracking  abnormalities.  Proper technique shown and discussed handout in great detail with ATC.  All questions were discussed and answered.      Impression and Recommendations:    The above documentation has been reviewed and is accurate and complete Tara Carr Carr Dima Mini, DO

## 2023-09-11 ENCOUNTER — Ambulatory Visit (INDEPENDENT_AMBULATORY_CARE_PROVIDER_SITE_OTHER)

## 2023-09-11 ENCOUNTER — Encounter: Payer: Self-pay | Admitting: Family Medicine

## 2023-09-11 ENCOUNTER — Ambulatory Visit: Admitting: Family Medicine

## 2023-09-11 ENCOUNTER — Other Ambulatory Visit: Payer: Self-pay

## 2023-09-11 VITALS — BP 122/82 | HR 76 | Ht 62.0 in | Wt 170.0 lb

## 2023-09-11 DIAGNOSIS — M25561 Pain in right knee: Secondary | ICD-10-CM | POA: Diagnosis not present

## 2023-09-11 DIAGNOSIS — G8929 Other chronic pain: Secondary | ICD-10-CM

## 2023-09-11 DIAGNOSIS — M1711 Unilateral primary osteoarthritis, right knee: Secondary | ICD-10-CM | POA: Insufficient documentation

## 2023-09-11 NOTE — Assessment & Plan Note (Signed)
 Significant patellofemoral arthritis.  X-rays pending.  We do expect it to be nearly bone-on-bone.  Discussed with patient about different treatment options.  Elected to try conservative therapy with home exercises and work with Event organiser, patient is going to be moving in 3 months so we do need to try to get her feeling significantly better.  Lateral tracking of the patella noted so a Tru pull lite brace given today.  Discussed icing regimen and home exercises.  Increase activity slowly otherwise.  Follow-up again in 6 to 8 weeks otherwise.  Worsening pain will consider injection if necessary.

## 2023-09-11 NOTE — Patient Instructions (Addendum)
 Xray today Brace Ice after activity 2400mg  of tart cherry extract See me in 6-8 weeks

## 2023-09-19 ENCOUNTER — Ambulatory Visit: Payer: Self-pay | Admitting: Family Medicine

## 2023-10-22 NOTE — Progress Notes (Unsigned)
 [facility] [address] [place] [address] Phone: [phone] Subjective:   Tara Carr, am serving as a scribe for Dr. Arthea Claudene.  I'Carr seeing this patient by the request  of:  Tara Harvey, MD  CC: Right knee pain  YEP:Dlagzrupcz  09/11/2023 Significant patellofemoral arthritis.  X-rays pending.  We do expect it to be nearly bone-on-bone.  Discussed with patient about different treatment options.  Elected to try conservative therapy with home exercises and work with Event organiser, patient is going to be moving in 3 months so we do need to try to get her feeling significantly better.  Lateral tracking of the patella noted so a Tru pull lite brace given today.  Discussed icing regimen and home exercises.  Increase activity slowly otherwise.  Follow-up again in 6 to 8 weeks otherwise.  Worsening pain will consider injection if necessary.     Updated 10/23/2023 Tara Carr is a 68 y.o. female coming in with complaint of R knee pain. Has been up and down. Hasn't been consistent with exercises. Taking the tart cherry. Starting metformin.       Past Medical History:  Diagnosis Date   Allergy    Anxiety    Arthritis    Cataract    forming   Depression    Esophageal stricture 11/29/2021   Dilated 11/2021   GERD (gastroesophageal reflux disease)    Hx of adenomatous colonic polyps 11/29/2021   3 diminutive - recall 2028   Hyperlipidemia    Neuromuscular disorder (HCC)    hiatal hernia   Osteoporosis    PONV (postoperative nausea and vomiting)    Prediabetes    Past Surgical History:  Procedure Laterality Date   COLONOSCOPY W/ POLYPECTOMY  11/2021   3 adenomas recall 2028   ESOPHAGOGASTRODUODENOSCOPY (EGD) WITH ESOPHAGEAL DILATION  11/2021   laproscopy     Social History   Socioeconomic History   Marital status: Divorced    Spouse name: Not on file   Number of children: Not on file   Years of education: Not on file    Highest education level: Not on file  Occupational History   Not on file  Tobacco Use   Smoking status: Never   Smokeless tobacco: Never  Vaping Use   Vaping status: Never Used  Substance and Sexual Activity   Alcohol use: Yes    Comment: OCC  maybe once a month   Drug use: Never   Sexual activity: Not on file  Other Topics Concern   Not on file  Social History Narrative   Divorced has a daughter that lives in [place] , patient anticipates moving in with her in late 2025   Social Drivers of Health   Financial Resource Strain: Not on file  Food Insecurity: Not on file  Transportation Needs: Not on file  Physical Activity: Not on file  Stress: Not on file  Social Connections: Not on file   No Known Allergies Family History  Problem Relation Age of Onset   Colon cancer Neg Hx    Colon polyps Neg Hx    Esophageal cancer Neg Hx    Stomach cancer Neg Hx    Rectal cancer Neg Hx     Current Outpatient Medications (Endocrine & Metabolic):    alendronate (FOSAMAX) 70 MG tablet, Take 70 mg by mouth once a week.  Current Outpatient Medications (Cardiovascular):    atorvastatin (LIPITOR) 20 MG tablet, Take 20 mg by mouth  daily.     Current Outpatient Medications (Other):    b complex vitamins capsule, Take 1 capsule by mouth daily.   Calcium 250 MG CAPS, 3 tabs   co-enzyme Q-10 30 MG capsule, Take 30 mg by mouth 3 (three) times daily.   Ginger, Zingiber officinalis, (GINGER PO), Take by mouth.   KRILL OIL PO, Take by mouth.   Lutein-Zeaxanthin 25-5 MG CAPS, Take by mouth.   omeprazole  (PRILOSEC) 20 MG capsule, Take 1 capsule (20 mg total) by mouth daily.   Turmeric (QC TUMERIC COMPLEX) 500 MG CAPS, Take 1 capsule by mouth daily.   Vitamin D , Ergocalciferol , 50 MCG (2000 UT) CAPS, 1 capsule   Reviewed prior external information including notes and imaging from  primary care provider As well as notes that were available from care everywhere and other  healthcare systems.  Past medical history, social, surgical and family history all reviewed in electronic medical record.  No pertanent information unless stated regarding to the chief complaint.   Review of Systems:  No headache, visual changes, nausea, vomiting, diarrhea, constipation, dizziness, abdominal pain, skin rash, fevers, chills, night sweats, weight loss, swollen lymph nodes, body aches, joint swelling, chest pain, shortness of breath, mood changes. POSITIVE muscle aches  Objective  Blood pressure (!) 130/90, pulse 86, height 5' 2 (1.575 Carr), weight 169 lb (76.7 kg), SpO2 97%.   General: No apparent distress alert and oriented x3 mood and affect normal, dressed appropriately.  HEENT: Pupils equal, extraocular movements intact  Respiratory: Patient's speak in full sentences and does not appear short of breath  Cardiovascular: No lower extremity edema, non tender, no erythema  Trace effusion noted.  Mild antalgic gait noted.  Lacks less than 10 degrees of flexion of the knee.  Lacks the last 5 degrees of extension of the knee.  After informed written and verbal consent, patient was seated on exam table. Right knee was prepped with alcohol swab and utilizing anterolateral approach, patient's right knee space was injected with 4:1  marcaine 0.5%: Kenalog 40mg /dL. Patient tolerated the procedure well without immediate complications.    Impression and Recommendations:     The above documentation has been reviewed and is accurate and complete Tara Carr Tara Jacko, DO

## 2023-10-23 ENCOUNTER — Ambulatory Visit: Admitting: Family Medicine

## 2023-10-23 ENCOUNTER — Encounter: Payer: Self-pay | Admitting: Family Medicine

## 2023-10-23 VITALS — BP 130/90 | HR 86 | Ht 62.0 in | Wt 169.0 lb

## 2023-10-23 DIAGNOSIS — M1711 Unilateral primary osteoarthritis, right knee: Secondary | ICD-10-CM

## 2023-10-23 NOTE — Assessment & Plan Note (Signed)
 Patient given injection and tolerated the procedure well, discussed icing regimen and home exercises, increase activity slowly.  Continue to use the brace.  Worsening pain could be a candidate for viscosupplementation.  Will see if we can get approval if necessary.  Follow-up again 2 months patient is going to likely be moving to [place] in the relatively near future

## 2023-10-23 NOTE — Patient Instructions (Addendum)
 Good to see you. Injected R knee today. Repeat of Exercises. See me again in 6 to 8 weeks.

## 2023-11-10 ENCOUNTER — Telehealth: Payer: Self-pay | Admitting: Family Medicine

## 2023-11-10 NOTE — Telephone Encounter (Signed)
 Link sent to patient

## 2023-11-10 NOTE — Telephone Encounter (Signed)
 Patient called asking if exercises from her recent visit could be sent to her through MyChart. She said that when she went to scan the QR code, the link had already expired.

## 2023-12-11 NOTE — Progress Notes (Signed)
 [name] [address] [place] [address] Phone: [phone] Subjective:   Tara Carr, am serving as a scribe for Dr. Arthea Claudene.  I'm seeing this patient by the request  of:  [name], MD  CC: Right knee pain  YEP:Dlagzrupcz  10/23/2023 Patient given injection and tolerated the procedure well, discussed icing regimen and home exercises, increase activity slowly.  Continue to use the brace.  Worsening pain could be a candidate for viscosupplementation.  Will see if we can get approval if necessary.  Follow-up again 2 months patient is going to likely be moving to [place] in the relatively near future     Updated 12/17/2023 Tara Carr is a 68 y.o. female coming in with complaint of r knee.  Known patellofemoral arthritis.  Given injection July 10.  Patient was to continue with conservative therapy.  Patient states that she is doing a lot better. Does have pain from time to time especially when descending stairs. Feels more stable and safer.        Past Medical History:  Diagnosis Date   Allergy    Anxiety    Arthritis    Cataract    forming   Depression    Esophageal stricture 11/29/2021   Dilated 11/2021   GERD (gastroesophageal reflux disease)    Hx of adenomatous colonic polyps 11/29/2021   3 diminutive - recall 2028   Hyperlipidemia    Neuromuscular disorder (HCC)    hiatal hernia   Osteoporosis    PONV (postoperative nausea and vomiting)    Prediabetes    Past Surgical History:  Procedure Laterality Date   COLONOSCOPY W/ POLYPECTOMY  11/2021   3 adenomas recall 2028   ESOPHAGOGASTRODUODENOSCOPY (EGD) WITH ESOPHAGEAL DILATION  11/2021   laproscopy     Social History   Socioeconomic History   Marital status: Divorced    Spouse name: Not on file   Number of children: Not on file   Years of education: Not on file   Highest education level: Not on file  Occupational History   Not on file  Tobacco  Use   Smoking status: Never   Smokeless tobacco: Never  Vaping Use   Vaping status: Never Used  Substance and Sexual Activity   Alcohol use: Yes    Comment: OCC  maybe once a month   Drug use: Never   Sexual activity: Not on file  Other Topics Concern   Not on file  Social History Narrative   Divorced has a daughter that lives in [place] , patient anticipates moving in with her in late 2025   Social Drivers of Health   Financial Resource Strain: Not on file  Food Insecurity: Not on file  Transportation Needs: Not on file  Physical Activity: Not on file  Stress: Not on file  Social Connections: Not on file   No Known Allergies Family History  Problem Relation Age of Onset   Colon cancer Neg Hx    Colon polyps Neg Hx    Esophageal cancer Neg Hx    Stomach cancer Neg Hx    Rectal cancer Neg Hx     Current Outpatient Medications (Endocrine & Metabolic):    alendronate (FOSAMAX) 70 MG tablet, Take 70 mg by mouth once a week.  Current Outpatient Medications (Cardiovascular):    atorvastatin (LIPITOR) 20 MG tablet, Take 20 mg by mouth daily.     Current Outpatient Medications (Other):    b complex  vitamins capsule, Take 1 capsule by mouth daily.   Calcium 250 MG CAPS, 3 tabs   co-enzyme Q-10 30 MG capsule, Take 30 mg by mouth 3 (three) times daily.   Ginger, Zingiber officinalis, (GINGER PO), Take by mouth.   KRILL OIL PO, Take by mouth.   Lutein-Zeaxanthin 25-5 MG CAPS, Take by mouth.   omeprazole  (PRILOSEC) 20 MG capsule, Take 1 capsule (20 mg total) by mouth daily.   Turmeric (QC TUMERIC COMPLEX) 500 MG CAPS, Take 1 capsule by mouth daily.   Vitamin D , Ergocalciferol , 50 MCG (2000 UT) CAPS, 1 capsule    Objective  Blood pressure (!) 142/90, pulse 70, height 5' 2 (1.575 m), weight 168 lb (76.2 kg), SpO2 97%.   General: No apparent distress alert and oriented x3 mood and affect normal, dressed appropriately.  HEENT: Pupils equal, extraocular movements  intact  Respiratory: Patient's speak in full sentences and does not appear short of breath  Cardiovascular: No lower extremity edema, non tender, no erythema  Right knee exam shows no significant inflammation at this time.  He does have some mild crepitus of the kneecap but overall full range of motion and ambulating well without any type of    Impression and Recommendations:    The above documentation has been reviewed and is accurate and complete Kimbrely Buckel M Kendahl Bumgardner, DO

## 2023-12-17 ENCOUNTER — Telehealth: Payer: Self-pay

## 2023-12-17 ENCOUNTER — Ambulatory Visit (INDEPENDENT_AMBULATORY_CARE_PROVIDER_SITE_OTHER): Admitting: Family Medicine

## 2023-12-17 VITALS — BP 142/90 | HR 70 | Ht 62.0 in | Wt 168.0 lb

## 2023-12-17 DIAGNOSIS — M1711 Unilateral primary osteoarthritis, right knee: Secondary | ICD-10-CM | POA: Diagnosis not present

## 2023-12-17 NOTE — Telephone Encounter (Signed)
 Patient needs an appointment once medication is in stock.  Monovisc for right nee approved.  Primary: Deductible applies. Since the deductible has been met, patient is responsible for a coinsurance. Prior authorization is not required. Secondary: The secondary plan follows Medicare guidelines. It will pick up remaining eligible expenses at 100%. It does not cover the Medicare Part B deductible. Confirmed with IVR/Availity 12/17/2023.  Case ID: [id number] Exp: 06/15/2024

## 2023-12-17 NOTE — Telephone Encounter (Signed)
-----   Message from Joesph JONELLE Gully sent at 12/17/2023  7:32 AM EDT ----- Regarding: Gel approval Walterine Holt,  She has an appointment at 1:15 today and Claudene wants to know if we can get her approved for R knee gel by then.  Thanks

## 2023-12-17 NOTE — Assessment & Plan Note (Signed)
 Significant improvement noted at this time.  Patient is going to be moving.  At this point given a handout about viscosupplementation.  Will see if we can find her names of different providers that could be helpful as well.  Follow-up again as needed.

## 2023-12-17 NOTE — Patient Instructions (Signed)
 Read about visco Hope the move goes well I will send you a message about providers

## 2023-12-17 NOTE — Telephone Encounter (Signed)
 Patient ran for Monovisc for right knee on 12/17/23. Case [id number]. Pending approval.

## 2023-12-22 NOTE — Telephone Encounter (Signed)
 Per OV note: Patient is going to be moving. At this point given a handout about viscosupplementation. Will see if we can find her names of different providers that could be helpful as well. Follow-up again as needed.

## 2024-02-16 ENCOUNTER — Telehealth: Payer: Self-pay | Admitting: Family Medicine

## 2024-02-16 NOTE — Telephone Encounter (Signed)
 Patient called to follow back up with Dr Claudene on his suggestions for a primary doctor since she has moved to [place]. She said that they discussed this with him at his last visit but she did not see where he messaged her on MyChart about it.  Please advise.

## 2024-02-19 NOTE — Telephone Encounter (Signed)
 The patient called stating that this doctor is no longer accepting new patients and she does not have a waiting list.  She asked if there was someone else Dr Claudene recommended or if he had any other ideas?
